# Patient Record
Sex: Female | Born: 1935 | Race: White | Hispanic: No
Health system: Southern US, Community
[De-identification: ages and names within clinical notes are randomized; demographics above are authoritative.]

## PROBLEM LIST (undated history)

## (undated) DIAGNOSIS — E1169 Type 2 diabetes mellitus with other specified complication: Secondary | ICD-10-CM

## (undated) DIAGNOSIS — E119 Type 2 diabetes mellitus without complications: Secondary | ICD-10-CM

## (undated) DIAGNOSIS — E039 Hypothyroidism, unspecified: Secondary | ICD-10-CM

## (undated) DIAGNOSIS — F03C0 Unspecified dementia, severe, without behavioral disturbance, psychotic disturbance, mood disturbance, and anxiety: Secondary | ICD-10-CM

## (undated) DIAGNOSIS — E1159 Type 2 diabetes mellitus with other circulatory complications: Secondary | ICD-10-CM

## (undated) DIAGNOSIS — I152 Hypertension secondary to endocrine disorders: Secondary | ICD-10-CM

## (undated) HISTORY — PX: PARTIAL COLECTOMY: SHX5273

---

## 2002-09-01 ENCOUNTER — Encounter: Payer: Self-pay | Admitting: Orthopedic Surgery

## 2002-09-07 ENCOUNTER — Encounter: Payer: Self-pay | Admitting: Orthopedic Surgery

## 2002-09-07 ENCOUNTER — Inpatient Hospital Stay (HOSPITAL_COMMUNITY): Admission: RE | Admit: 2002-09-07 | Discharge: 2002-09-12 | Payer: Self-pay | Admitting: Orthopedic Surgery

## 2004-03-23 ENCOUNTER — Ambulatory Visit (HOSPITAL_COMMUNITY): Admission: RE | Admit: 2004-03-23 | Discharge: 2004-03-23 | Payer: Self-pay | Admitting: Gastroenterology

## 2004-03-23 ENCOUNTER — Encounter (INDEPENDENT_AMBULATORY_CARE_PROVIDER_SITE_OTHER): Payer: Self-pay | Admitting: Specialist

## 2004-07-04 ENCOUNTER — Encounter: Admission: RE | Admit: 2004-07-04 | Discharge: 2004-07-04 | Payer: Self-pay | Admitting: Internal Medicine

## 2005-07-30 ENCOUNTER — Encounter: Admission: RE | Admit: 2005-07-30 | Discharge: 2005-07-30 | Payer: Self-pay | Admitting: Internal Medicine

## 2010-01-22 ENCOUNTER — Emergency Department (HOSPITAL_COMMUNITY): Admission: EM | Admit: 2010-01-22 | Discharge: 2010-01-22 | Payer: Self-pay | Admitting: Emergency Medicine

## 2010-04-02 ENCOUNTER — Encounter: Payer: Self-pay | Admitting: Internal Medicine

## 2010-04-03 ENCOUNTER — Encounter: Payer: Self-pay | Admitting: Internal Medicine

## 2010-07-28 NOTE — Discharge Summary (Signed)
NAME:  Denise Woodward, Denise Woodward                   ACCOUNT NO.:  [id number]   MEDICAL RECORD NO.:  [id number]                   PATIENT TYPE:  INP   LOCATION:  0455                                 FACILITY:  [facility]   PHYSICIAN:  Ollen Gross, M.D.                 DATE OF BIRTH:  1919/07/27   DATE OF ADMISSION:  09/07/2002  DATE OF DISCHARGE:  09/12/2002                                 DISCHARGE SUMMARY   ADMITTING DIAGNOSES:  1. Osteoarthritis right hip.  2. Anxiety.  3. Hypertension.  4. Urinary incontinence.  5. Non-insulin-dependent diabetes mellitus.  6. Hypothyroidism.  7. History of colon cancer.  8. Glaucoma.  9. 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   DISCHARGE DIAGNOSES:  1. Osteoarthritis right hip, status post right total hip arthroplasty.  2. Postoperative blood loss anemia.  3. Status post transfusion without sequelae.  4. Anxiety.  5. Hypertension.  6. Urinary incontinence.  7. Non-insulin-dependent diabetes mellitus.  8. Hypothyroidism.  9. History of colon cancer.  10.      Glaucoma.  11.      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   PROCEDURE:  Date of surgery September 07, 2002:  Right total knee arthroplasty.  Surgeon:  Dr. Lequita Halt; assisted by Avel Peace, P.A.-C.  Spinal anesthesia.  Estimated blood loss 600 mL.  Hemovac drain x1.   CONSULTS:  Rehab - Dr. Faith Rogue.   BRIEF HISTORY:  An 77 year old female seen by Dr. Lequita Halt.  Her daughter,  [name], currently lives in [place].  The patient currently resides in  [place] but is here visiting her daughter and was developing significantly  worsening right hip pain.  Was seen in the office but did not recall any  specific injury to this.  She does walk quite a bit and is very active.  She  experienced pain in the groin traveling down to the interior thigh and knee.  She was seen in the office where x-rays which are brought from [place] with  the patient demonstrates severe  bone-on-bone arthritis in the right hip with  some osteopenia of the femoral neck.  It is noted that she has a cemented  left total hip in good position on the other side.  The pain in the right  hip has become more debilitating and problematic.  It is felt she had  reached a point where she would benefit from surgery.  Risks and benefits  discussed and the patient elected to proceed with surgery.   LABORATORY DATA:  CBC on admission showed hemoglobin 12.6, hematocrit of  37.3, white cell count 7.4, red cell count 4.08.  Differential all within  normal limits.  Postoperative H&H 9.1 and 26.5, continued to drop down to  8.3 and 24.4.  Given blood.  Posttransfusion hemoglobin 10.4 and 30.5.  PT/PTT on admission were 12.3 and 39 respectively with an INR of 0.9.  Serum  protimes followed.  Last noted PT/INR 22.8/2.3.  Chem panel on admission  showed low sodium of 133, low potassium of 3.4, elevated glucose of 153;  remaining chem panel within normal limits.  Serial BMETs were followed.  Sodium continued to drop down from 133 down to 130, last noted at 129.  Potassium went from 3.4 to 3.6, back down to 3.4, back up to 3.6.  Glucose  dropped from 153 down to 127.  Calcium dropped from 10.1 down to 8.2.  Urinalysis on admission showed moderate leukocyte esterase, few epithelial  cells, 0-2 white cells.  Blood group and type O negative.   X-rays:  Portable pelvis film and hip film postoperatively shows right hip  prosthesis in appropriate position, no evidence of fracture or dislocation.  Preoperative right hip films on September 01, 2002 show osteoarthritis of the  right hip.  I do not see a chest x-ray or EKG.   HOSPITAL COURSE:  The patient was admitted to [facility] on September 07, 2002 and taken to the OR and underwent the above-stated procedure.  She  tolerated the procedure well, was later transferred to the recovery room and  then to the orthopedic floor to continue postoperative care.   Vital signs  were followed.  Hemovac drain placed at the time of surgery was pulled on  postoperative day #1.  The patient underwent a consult by Dr. Riley Kill for  rehab services.  Seen by rehab and felt that due to her excellent condition  that she would probably be able to go home with home health.  The patient  did have some mild postoperative anemia which was followed during the  hospital course, continued to drop, and the patient did receive blood on  postoperative day #3.  The patient was given 24 hours of postoperative IV  antibiotics.  PT and OT consulted for evaluation and treatment.  Initially  placed on PCA, was weaned over to p.o. medications by day #2 when the PCA  was discontinued.  Dressing changes initiated on postoperative day  #2.  Incision was healing well, no signs of infection.  The patient  initially slowly progressed with physical therapy, ambulating approximately  only 20 feet by postoperative day #2, then up to 50 feet by postoperative  day #3, and then up to 100 feet by postoperative day #4.  She did quite well  with the physical therapy in the latter portion of the hospital course.  She  was doing so well she was set up to go home on day #5.  She was allowed to  be partial weightbearing of 50% when she was up moving well.  Arrangements  were made for home care and she was discharged home on September 12, 2002.   DISCHARGE PLAN:  1. The patient was discharged home on September 12, 2002.  2. Discharge diagnoses:  Please see above.  3. Discharge medications:  Coumadin for DVT prophylaxis, Percocet for pain,     Robaxin for spasm.  4. Activity:  Partial weightbearing on the right lower extremity, 50%.  Home     health PT and home health nurse through [facility], total hip     protocol.  5. Follow-up:  Two weeks.  6. Diet:  Low sodium diabetic diet.   DISPOSITION:  Home with her daughter.   CONDITION ON DISCHARGE:  Improved.    Alexzandrew L. Julien Girt, P.A.               Ollen Gross, M.D.  ALP/MEDQ  D:  10/14/2002  T:  10/14/2002  Job:  [id number]

## 2010-07-28 NOTE — Op Note (Signed)
NAME:  Denise Woodward, Denise Woodward                   ACCOUNT NO.:  [id number]   MEDICAL RECORD NO.:  [id number]                   PATIENT TYPE:  INP   LOCATION:  X009                                 FACILITY[facility]  [facility]   PHYSICIAN:  Ollen Gross, M.D.                 DATE OF BIRTH:  09/24/1919   DATE OF PROCEDURE:  09/07/2002  DATE OF DISCHARGE:                                 OPERATIVE REPORT   PREOPERATIVE DIAGNOSIS:  Osteoarthritis, right hip.   POSTOPERATIVE DIAGNOSIS:  Osteoarthritis, right hip.   OPERATION/PROCEDURE:  Right total hip arthroplasty.   SURGEON:  Ollen Gross, M.D.   ASSISTANT:  Alexzandrew L. Julien Girt, P.A.   ANESTHESIA:  Spinal.   ESTIMATED BLOOD LOSS:  600 mL.   DATA REVIEWED:  Hemovac x1.   COMPLICATIONS:  None.   CONDITION:  Stable to recovery room.   BRIEF CLINICAL NOTE:  Denise Woodward is an 77 year old female who has severe  end-stage osteoarthritis of the right hip with pain refractory to  nonoperative management.  She presents now for a right total hip  arthroplasty.   DESCRIPTION OF PROCEDURE:  After the successful administration of spinal  anesthetic, the patient was placed in the left lateral decubitus position  with the right side up and held with the hip positioner.  Right lower  extremity was isolated from her perineum with plastic drapes and prepped and  draped in the usual sterile fashion.  A posterolateral incision was made  with a #10 blade through the subcutaneous tissue to the level of the fascia  lata which was incised in line with the skin incision.  Sciatic nerve was  palpated and protected and short rotators isolated off the femur.  The  capsulectomy was then performed.  The dislocated center of the femoral head  marked and trial prosthesis placed such that the center of the trial head  corresponds to the center of her native femoral head.  Osteotomy line is  marked on the femoral neck and osteotomy is made with an oscillating  saw.  The skin was then retracted anteriorly and acetabulum exposure obtained.   Removal of the labrum and then initiated reaming at 45, increasing  increments of 2 to 49 and a 50 mm Pinnacle acetabular shell was placed in  the anatomic position, transfixed with two dome screws.  Trial 28 mm neutral  +4 liner was placed.   The femur was prepared first at the canal finder and irrigation.  Broaching  is performed to size 1 then size 2, high offset neck with a 28 +0 head.  Head is reduced with excellent stability.  Full extension, flexor rotation,  70 degrees flexion and 40 degrees adduction, 90 degrees internal rotation,  90 degrees flexion, 70 degrees internal rotation.  All the trials were then  removed and the permanent apex fully eliminator and permanent 28 mm neutral  +4 liner placed in the acetabular shell.  Sponges there is  placed to protect  the liner.  The cement restrictor sized to 4 and then a size 4 cement  restrictor was placed into the appropriate depth in the femoral canal and  the canal was prepared with pulsatile lavage.  Cement was mixed and  implantation with hand pressurization.  The size 2 Endurance Luster high  offset stem was placed in anterior anatomic version.  Once the cement had  hardened, a 28 + 1.5 head was placed and hip reduced with the same stability  parameters.  The wound was copiously irrigated with antibiotic solution and  short rotators reattached to the femur through drill holes.  Fascia lata was  closed over a Hemovac drain with interrupted #1 Vicryl, subcu closed with #1  and 2-0 Vicryl, subcuticular running 4-0 Monocryl.  Incision was clean and  dry and Steri-Strips and a bulky sterile dressing applied. She was then  awakened and transported to the recovery in stable condition.                                               Ollen Gross, M.D.    FA/MEDQ  D:  09/07/2002  T:  09/07/2002  Job:  [id number]

## 2010-07-28 NOTE — H&P (Signed)
NAME:  Denise Woodward, Denise Woodward                   ACCOUNT NO.:  [id number]   MEDICAL RECORD NO.:  [id number]                   PATIENT TYPE:  INP   LOCATION:  NA                                   FACILITY:  [facility]   PHYSICIAN:  Ollen Gross, M.D.                 DATE OF BIRTH:  04/11/1919   DATE OF ADMISSION:  09/07/2002  DATE OF DISCHARGE:                                HISTORY & PHYSICAL   CHIEF COMPLAINT:  Right hip pain.   HISTORY OF PRESENT ILLNESS:  This is an 77 year old female seen in  consultation by Ollen Gross, M.D.  Her daughter, Denise Woodward, lives here in  [place].  Currently, the patient is residing in [place] but she is here  visiting and has been developing significantly worsening right hip pain.  She is seen in the office and did not recall any specific injury leading to  this.  She does walk a lot and is quite active.  She is experiencing pain in  the groin traveling down to anterior thigh and knee.  She denies any type of  lower extremity weakness or paraesthesias.  She has not had any swelling in  the knee.  She has had a previous left hip replacement and her symptoms were  just like this prior to at the time that she had to have it replaced.  Her  right hip has gotten progressively worse.  She is seen in the office where x-  rays were brought in from [place] which demonstrate severe bone on bone  arthritis of the right hip with some osteopenia of the femoral neck.  It is  noted she has a cemented left total hip arthroplasty in good position on the  other side.  The patient states the pain is becoming more debilitating and  problematic.  She is at a point where she would like to have something done  about it.  Risks and benefits of the surgery have been discussed with the  patient at length with her and her daughter and she would like to proceed  with surgery.   ALLERGIES:  ASPIRIN.   CURRENT MEDICATIONS:  1. Triamterene/hydrochlorothiazide 37.5/25 daily.  2.  Lotrel 10/20 daily.  3. Toprol XL 100 mg one and one-half tablet daily.  4. Lipitor 10 mg daily.  5. Synthroid tablets 50 mcg daily.  6. Paroxetine/hydrochlorothiazide 10 mg daily.   PAST MEDICAL HISTORY:  1. Anxiety.  2. Hypertension.  3. Urinary incontinence.  4. Non-insulin-dependent diabetes mellitus.  5. Hypothyroidism.  6. Colon cancer.  7. Glaucoma.  8. Cataracts.   PAST SURGICAL HISTORY:  1. Rotator cuff repair.  2. Left total hip replacement arthroplasty.  3. Colon resection.  4. Laser surgery.   SOCIAL HISTORY:  Widowed.  Homemaker.  Nonsmoker.  Occasional intake of  alcohol.  The patient has six children.  Her daughter, Denise Woodward, lives  here in [place], will be assisting with her care.  FAMILY HISTORY:  Father with a history of heart disease, hypertension,  diabetes.  Mother with a history of hypertension.  Has a sister with history  of uterine cancer.  Has siblings that have hypertension.   REVIEW OF SYSTEMS:  GENERAL:  No fevers, chills, night sweats.  NEUROLOGIC:  No seizures, syncope, paralysis.  RESPIRATORY:  No shortness of breath,  productive cough, or hemoptysis.  CARDIOVASCULAR:  No chest pain, angina,  orthopnea.  GASTROINTESTINAL:  No nausea, vomiting, diarrhea, constipation,  or blood or mucus in the stool.  GENITOURINARY:  No dysuria, hematuria,  discharge.  MUSCULOSKELETAL:  Pertinent to that of hip found in the history  of present illness.   PHYSICAL EXAMINATION:  VITAL SIGNS:  Pulse 64, respirations 14, blood  pressure 140/80.  GENERAL:  The patient is an 77 year old female well-nourished, well-  developed.  She is accompanied by her daughter.  She is alert, oriented,  cooperative, very pleasant at time of examination.  She is a good historian.  HEENT:  Normocephalic, atraumatic.  Pupils round, reactive.  Oropharynx  clear.  NECK:  Supple.  CHEST:  Clear to auscultation anterior/posterior chest walls.  No rhonchi,  rales, or wheezing.   HEART:  Regular rate and rhythm.  No murmurs.  S1, S2 noted.  ABDOMEN:  Soft, slightly round, nontender.  She does have an abdominal  hernia from the previous abdominal incision from her colon resection.  RECTAL:  Not done.  Not pertinent to present illness.  BREASTS:  Not done.  Not pertinent to present illness.  GENITALIA:  Not done.  Not pertinent to present illness.  EXTREMITIES:  Significant that to the right lower extremity.  She has hip  flexion about 90 degrees, internal rotation, external rotation at 10 and 20,  abduction of about 20 degrees.  She does have discomfort about the hip on  passive range of motion.   IMPRESSION:  1. Osteoarthritis right hip.  2. Anxiety.  3. Hypertension.  4. Urinary incontinence.  5. Non-insulin-dependent diabetes mellitus.  6. Hypothyroidism.  7. History of colon cancer.  8. Glaucoma.  9. Status post left total hip replacement arthroplasty February 1999.   PLAN:  The patient will be admitted to [facility], undergo a right  total hip replacement arthroplasty.  Surgery will be performed by Ollen Gross, M.D.     Alexzandrew L. Julien Girt, P.A.              Ollen Gross, M.D.    ALP/MEDQ  D:  09/03/2002  T:  09/04/2002  Job:  [id number]

## 2010-07-28 NOTE — Op Note (Signed)
NAMEMARGY, SUMLER         ACCOUNT NO.:  [id number]   MEDICAL RECORD NO.:  [id number]          PATIENT TYPE:  AMB   LOCATION:  ENDO                         FACILITY:  [facility]   PHYSICIAN:  Petra Kuba, M.D.    DATE OF BIRTH:  03/23/1919   DATE OF PROCEDURE:  03/23/2004  DATE OF DISCHARGE:                                 OPERATIVE REPORT   PROCEDURES:  Colonoscopy with biopsy.   INDICATIONS:  History of colon cancer.  Due for repeat screening.  Consent  was signed after the risks, benefits, methods and options were thoroughly  discussed in the office with both the patient and her daughter.   MEDICINES USED:  1.  Demerol 50 mg.  2.  Versed 4 mg.   DESCRIPTION OF PROCEDURE:  Rectal inspection was pertinent for external  hemorrhoids.  Digital exam was negative.  The video pediatric adjustable  coloscope was inserted and easily advanced through the left side of the  colon where the anastomosis was seen through the mid transverse.  At that  point there was some looping.  With rolling her on her back and abdominal  pressure, we were able to advance to the cecum.  There was some formed stool  in the cecum which could not be washed and suctioned.  The rest of the prep  was adequate with minimal liquid stool requiring washing and suctioning.  In  the cecal pole a tiny cecal polyp was seen, was cold biopsied x 1 and put in  a fresh container.  On slow withdrawal through the colon no abnormalities  were seen.  As we slowly withdrew back to the sigmoid anastomosis there were  a few diverticula round this.  The anastomosis was widely patent without any  signs of polypoid tumors or mass.  In the distal rectal and sigmoid, a few  tiny hyperplastic-appearing polyps were seen and were cold biopsied and put  in a second container.  Anorectal pull through and retroflexion confirmed  some small hemorrhoids.  The scope was straightened and readvanced a short  ways up the left side of the colon.   Air was suctioned.  The scope was  removed.  The patient tolerated the procedure well.  There was no obvious  immediate complication.   ENDOSCOPIC DIAGNOSES:  1.  Internal and external hemorrhoids.  2.  Mid sigmoid anastomosis with a few ticks around it.  3.  Three tiny hyperplastic-appearing rectal and distal sigmoid polyps cold      biopsied.  4.  Tiny cecal polyp biopsied.  5.  Otherwise within normal limits to the cecum.   PLAN:  Await pathology, but if doing well medically consider repeat  screening in three to five years.  Happy to see back p.r.n.  Otherwise  return care to Dr. Edwyna Shell for the customary health care maintenance to  include yearly rectals and guaiacs.       MEM/MEDQ  D:  03/23/2004  T:  03/23/2004  Job:  [id number]   cc:   Loraine Leriche A. Waynard Edwards, M.D.  [address]  [place]  [place] [address]  Fax: [phone]

## 2011-03-15 DIAGNOSIS — E039 Hypothyroidism, unspecified: Secondary | ICD-10-CM | POA: Diagnosis not present

## 2011-03-15 DIAGNOSIS — H6120 Impacted cerumen, unspecified ear: Secondary | ICD-10-CM | POA: Diagnosis not present

## 2011-03-15 DIAGNOSIS — M899 Disorder of bone, unspecified: Secondary | ICD-10-CM | POA: Diagnosis not present

## 2011-03-15 DIAGNOSIS — E785 Hyperlipidemia, unspecified: Secondary | ICD-10-CM | POA: Diagnosis not present

## 2011-03-15 DIAGNOSIS — I10 Essential (primary) hypertension: Secondary | ICD-10-CM | POA: Diagnosis not present

## 2011-03-15 DIAGNOSIS — E119 Type 2 diabetes mellitus without complications: Secondary | ICD-10-CM | POA: Diagnosis not present

## 2011-03-15 DIAGNOSIS — M949 Disorder of cartilage, unspecified: Secondary | ICD-10-CM | POA: Diagnosis not present

## 2011-03-19 DIAGNOSIS — Z1212 Encounter for screening for malignant neoplasm of rectum: Secondary | ICD-10-CM | POA: Diagnosis not present

## 2011-03-21 DIAGNOSIS — H6120 Impacted cerumen, unspecified ear: Secondary | ICD-10-CM | POA: Diagnosis not present

## 2011-03-21 DIAGNOSIS — Z124 Encounter for screening for malignant neoplasm of cervix: Secondary | ICD-10-CM | POA: Diagnosis not present

## 2011-03-21 DIAGNOSIS — N183 Chronic kidney disease, stage 3 unspecified: Secondary | ICD-10-CM | POA: Diagnosis not present

## 2011-03-21 DIAGNOSIS — E1139 Type 2 diabetes mellitus with other diabetic ophthalmic complication: Secondary | ICD-10-CM | POA: Diagnosis not present

## 2011-03-21 DIAGNOSIS — Z0000 Encounter for general adult medical examination without abnormal findings: Secondary | ICD-10-CM | POA: Diagnosis not present

## 2011-05-03 DIAGNOSIS — R82998 Other abnormal findings in urine: Secondary | ICD-10-CM | POA: Diagnosis not present

## 2011-05-03 DIAGNOSIS — R300 Dysuria: Secondary | ICD-10-CM | POA: Diagnosis not present

## 2011-06-11 DIAGNOSIS — R82998 Other abnormal findings in urine: Secondary | ICD-10-CM | POA: Diagnosis not present

## 2011-06-11 DIAGNOSIS — R300 Dysuria: Secondary | ICD-10-CM | POA: Diagnosis not present

## 2011-07-23 DIAGNOSIS — F068 Other specified mental disorders due to known physiological condition: Secondary | ICD-10-CM | POA: Diagnosis not present

## 2011-07-23 DIAGNOSIS — E039 Hypothyroidism, unspecified: Secondary | ICD-10-CM | POA: Diagnosis not present

## 2011-07-23 DIAGNOSIS — I10 Essential (primary) hypertension: Secondary | ICD-10-CM | POA: Diagnosis not present

## 2011-07-23 DIAGNOSIS — E119 Type 2 diabetes mellitus without complications: Secondary | ICD-10-CM | POA: Diagnosis not present

## 2011-09-20 DIAGNOSIS — R300 Dysuria: Secondary | ICD-10-CM | POA: Diagnosis not present

## 2011-09-20 DIAGNOSIS — R82998 Other abnormal findings in urine: Secondary | ICD-10-CM | POA: Diagnosis not present

## 2011-11-26 DIAGNOSIS — Z23 Encounter for immunization: Secondary | ICD-10-CM | POA: Diagnosis not present

## 2011-11-26 DIAGNOSIS — F068 Other specified mental disorders due to known physiological condition: Secondary | ICD-10-CM | POA: Diagnosis not present

## 2011-11-26 DIAGNOSIS — E119 Type 2 diabetes mellitus without complications: Secondary | ICD-10-CM | POA: Diagnosis not present

## 2011-11-26 DIAGNOSIS — E785 Hyperlipidemia, unspecified: Secondary | ICD-10-CM | POA: Diagnosis not present

## 2011-11-26 DIAGNOSIS — E039 Hypothyroidism, unspecified: Secondary | ICD-10-CM | POA: Diagnosis not present

## 2012-01-17 DIAGNOSIS — R82998 Other abnormal findings in urine: Secondary | ICD-10-CM | POA: Diagnosis not present

## 2012-01-17 DIAGNOSIS — N390 Urinary tract infection, site not specified: Secondary | ICD-10-CM | POA: Diagnosis not present

## 2012-05-23 DIAGNOSIS — Z79899 Other long term (current) drug therapy: Secondary | ICD-10-CM | POA: Diagnosis not present

## 2012-05-23 DIAGNOSIS — E039 Hypothyroidism, unspecified: Secondary | ICD-10-CM | POA: Diagnosis not present

## 2012-05-23 DIAGNOSIS — E1139 Type 2 diabetes mellitus with other diabetic ophthalmic complication: Secondary | ICD-10-CM | POA: Diagnosis not present

## 2012-05-23 DIAGNOSIS — M899 Disorder of bone, unspecified: Secondary | ICD-10-CM | POA: Diagnosis not present

## 2012-05-23 DIAGNOSIS — I10 Essential (primary) hypertension: Secondary | ICD-10-CM | POA: Diagnosis not present

## 2012-05-30 DIAGNOSIS — Z0000 Encounter for general adult medical examination without abnormal findings: Secondary | ICD-10-CM | POA: Diagnosis not present

## 2012-05-30 DIAGNOSIS — E119 Type 2 diabetes mellitus without complications: Secondary | ICD-10-CM | POA: Diagnosis not present

## 2012-05-30 DIAGNOSIS — N183 Chronic kidney disease, stage 3 unspecified: Secondary | ICD-10-CM | POA: Diagnosis not present

## 2012-05-30 DIAGNOSIS — E785 Hyperlipidemia, unspecified: Secondary | ICD-10-CM | POA: Diagnosis not present

## 2012-05-30 DIAGNOSIS — H409 Unspecified glaucoma: Secondary | ICD-10-CM | POA: Diagnosis not present

## 2012-05-30 DIAGNOSIS — D649 Anemia, unspecified: Secondary | ICD-10-CM | POA: Diagnosis not present

## 2012-05-30 DIAGNOSIS — F068 Other specified mental disorders due to known physiological condition: Secondary | ICD-10-CM | POA: Diagnosis not present

## 2012-05-30 DIAGNOSIS — Z79899 Other long term (current) drug therapy: Secondary | ICD-10-CM | POA: Diagnosis not present

## 2012-05-30 DIAGNOSIS — E039 Hypothyroidism, unspecified: Secondary | ICD-10-CM | POA: Diagnosis not present

## 2012-07-15 DIAGNOSIS — M899 Disorder of bone, unspecified: Secondary | ICD-10-CM | POA: Diagnosis not present

## 2012-07-15 DIAGNOSIS — M949 Disorder of cartilage, unspecified: Secondary | ICD-10-CM | POA: Diagnosis not present

## 2012-08-28 DIAGNOSIS — L909 Atrophic disorder of skin, unspecified: Secondary | ICD-10-CM | POA: Diagnosis not present

## 2012-08-28 DIAGNOSIS — L821 Other seborrheic keratosis: Secondary | ICD-10-CM | POA: Diagnosis not present

## 2012-08-28 DIAGNOSIS — Z411 Encounter for cosmetic surgery: Secondary | ICD-10-CM | POA: Diagnosis not present

## 2012-08-28 DIAGNOSIS — L919 Hypertrophic disorder of the skin, unspecified: Secondary | ICD-10-CM | POA: Diagnosis not present

## 2012-11-05 DIAGNOSIS — R300 Dysuria: Secondary | ICD-10-CM | POA: Diagnosis not present

## 2012-11-05 DIAGNOSIS — R82998 Other abnormal findings in urine: Secondary | ICD-10-CM | POA: Diagnosis not present

## 2012-12-11 DIAGNOSIS — Z23 Encounter for immunization: Secondary | ICD-10-CM | POA: Diagnosis not present

## 2013-01-21 DIAGNOSIS — Z1331 Encounter for screening for depression: Secondary | ICD-10-CM | POA: Diagnosis not present

## 2013-01-21 DIAGNOSIS — E119 Type 2 diabetes mellitus without complications: Secondary | ICD-10-CM | POA: Diagnosis not present

## 2013-01-21 DIAGNOSIS — I10 Essential (primary) hypertension: Secondary | ICD-10-CM | POA: Diagnosis not present

## 2013-01-21 DIAGNOSIS — Z23 Encounter for immunization: Secondary | ICD-10-CM | POA: Diagnosis not present

## 2013-01-21 DIAGNOSIS — Z6828 Body mass index (BMI) 28.0-28.9, adult: Secondary | ICD-10-CM | POA: Diagnosis not present

## 2013-01-21 DIAGNOSIS — F068 Other specified mental disorders due to known physiological condition: Secondary | ICD-10-CM | POA: Diagnosis not present

## 2013-06-03 DIAGNOSIS — N183 Chronic kidney disease, stage 3 unspecified: Secondary | ICD-10-CM | POA: Diagnosis not present

## 2013-06-03 DIAGNOSIS — E1129 Type 2 diabetes mellitus with other diabetic kidney complication: Secondary | ICD-10-CM | POA: Diagnosis not present

## 2013-06-03 DIAGNOSIS — E785 Hyperlipidemia, unspecified: Secondary | ICD-10-CM | POA: Diagnosis not present

## 2013-06-03 DIAGNOSIS — M899 Disorder of bone, unspecified: Secondary | ICD-10-CM | POA: Diagnosis not present

## 2013-06-03 DIAGNOSIS — M949 Disorder of cartilage, unspecified: Secondary | ICD-10-CM | POA: Diagnosis not present

## 2013-06-03 DIAGNOSIS — E039 Hypothyroidism, unspecified: Secondary | ICD-10-CM | POA: Diagnosis not present

## 2013-06-03 DIAGNOSIS — R82998 Other abnormal findings in urine: Secondary | ICD-10-CM | POA: Diagnosis not present

## 2013-06-10 DIAGNOSIS — I10 Essential (primary) hypertension: Secondary | ICD-10-CM | POA: Diagnosis not present

## 2013-06-10 DIAGNOSIS — E039 Hypothyroidism, unspecified: Secondary | ICD-10-CM | POA: Diagnosis not present

## 2013-06-10 DIAGNOSIS — N183 Chronic kidney disease, stage 3 unspecified: Secondary | ICD-10-CM | POA: Diagnosis not present

## 2013-06-10 DIAGNOSIS — H6120 Impacted cerumen, unspecified ear: Secondary | ICD-10-CM | POA: Diagnosis not present

## 2013-06-10 DIAGNOSIS — E119 Type 2 diabetes mellitus without complications: Secondary | ICD-10-CM | POA: Diagnosis not present

## 2013-06-10 DIAGNOSIS — H409 Unspecified glaucoma: Secondary | ICD-10-CM | POA: Diagnosis not present

## 2013-06-10 DIAGNOSIS — F068 Other specified mental disorders due to known physiological condition: Secondary | ICD-10-CM | POA: Diagnosis not present

## 2013-06-10 DIAGNOSIS — E785 Hyperlipidemia, unspecified: Secondary | ICD-10-CM | POA: Diagnosis not present

## 2013-06-10 DIAGNOSIS — Z0000 Encounter for general adult medical examination without abnormal findings: Secondary | ICD-10-CM | POA: Diagnosis not present

## 2013-09-22 DIAGNOSIS — H6120 Impacted cerumen, unspecified ear: Secondary | ICD-10-CM | POA: Diagnosis not present

## 2013-11-02 DIAGNOSIS — F068 Other specified mental disorders due to known physiological condition: Secondary | ICD-10-CM | POA: Diagnosis not present

## 2013-11-02 DIAGNOSIS — I10 Essential (primary) hypertension: Secondary | ICD-10-CM | POA: Diagnosis not present

## 2013-11-02 DIAGNOSIS — E039 Hypothyroidism, unspecified: Secondary | ICD-10-CM | POA: Diagnosis not present

## 2013-11-02 DIAGNOSIS — E119 Type 2 diabetes mellitus without complications: Secondary | ICD-10-CM | POA: Diagnosis not present

## 2013-11-02 DIAGNOSIS — IMO0002 Reserved for concepts with insufficient information to code with codable children: Secondary | ICD-10-CM | POA: Diagnosis not present

## 2013-12-04 DIAGNOSIS — Z23 Encounter for immunization: Secondary | ICD-10-CM | POA: Diagnosis not present

## 2013-12-23 DIAGNOSIS — N390 Urinary tract infection, site not specified: Secondary | ICD-10-CM | POA: Diagnosis not present

## 2014-05-10 DIAGNOSIS — H2513 Age-related nuclear cataract, bilateral: Secondary | ICD-10-CM | POA: Diagnosis not present

## 2014-05-10 DIAGNOSIS — E109 Type 1 diabetes mellitus without complications: Secondary | ICD-10-CM | POA: Diagnosis not present

## 2014-06-22 DIAGNOSIS — I10 Essential (primary) hypertension: Secondary | ICD-10-CM | POA: Diagnosis not present

## 2014-06-22 DIAGNOSIS — E785 Hyperlipidemia, unspecified: Secondary | ICD-10-CM | POA: Diagnosis not present

## 2014-06-22 DIAGNOSIS — R8299 Other abnormal findings in urine: Secondary | ICD-10-CM | POA: Diagnosis not present

## 2014-06-22 DIAGNOSIS — E1129 Type 2 diabetes mellitus with other diabetic kidney complication: Secondary | ICD-10-CM | POA: Diagnosis not present

## 2014-06-22 DIAGNOSIS — N183 Chronic kidney disease, stage 3 (moderate): Secondary | ICD-10-CM | POA: Diagnosis not present

## 2014-06-22 DIAGNOSIS — E039 Hypothyroidism, unspecified: Secondary | ICD-10-CM | POA: Diagnosis not present

## 2014-06-22 DIAGNOSIS — N390 Urinary tract infection, site not specified: Secondary | ICD-10-CM | POA: Diagnosis not present

## 2014-06-29 DIAGNOSIS — N183 Chronic kidney disease, stage 3 (moderate): Secondary | ICD-10-CM | POA: Diagnosis not present

## 2014-06-29 DIAGNOSIS — D649 Anemia, unspecified: Secondary | ICD-10-CM | POA: Diagnosis not present

## 2014-06-29 DIAGNOSIS — F0390 Unspecified dementia without behavioral disturbance: Secondary | ICD-10-CM | POA: Diagnosis not present

## 2014-06-29 DIAGNOSIS — E785 Hyperlipidemia, unspecified: Secondary | ICD-10-CM | POA: Diagnosis not present

## 2014-06-29 DIAGNOSIS — Z6823 Body mass index (BMI) 23.0-23.9, adult: Secondary | ICD-10-CM | POA: Diagnosis not present

## 2014-06-29 DIAGNOSIS — I10 Essential (primary) hypertension: Secondary | ICD-10-CM | POA: Diagnosis not present

## 2014-06-29 DIAGNOSIS — E119 Type 2 diabetes mellitus without complications: Secondary | ICD-10-CM | POA: Diagnosis not present

## 2014-06-29 DIAGNOSIS — H409 Unspecified glaucoma: Secondary | ICD-10-CM | POA: Diagnosis not present

## 2014-06-29 DIAGNOSIS — Z0000 Encounter for general adult medical examination without abnormal findings: Secondary | ICD-10-CM | POA: Diagnosis not present

## 2014-06-29 DIAGNOSIS — N390 Urinary tract infection, site not specified: Secondary | ICD-10-CM | POA: Diagnosis not present

## 2014-06-29 DIAGNOSIS — E039 Hypothyroidism, unspecified: Secondary | ICD-10-CM | POA: Diagnosis not present

## 2014-06-29 DIAGNOSIS — Z1389 Encounter for screening for other disorder: Secondary | ICD-10-CM | POA: Diagnosis not present

## 2014-08-16 DIAGNOSIS — N390 Urinary tract infection, site not specified: Secondary | ICD-10-CM | POA: Diagnosis not present

## 2014-12-19 DIAGNOSIS — Z23 Encounter for immunization: Secondary | ICD-10-CM | POA: Diagnosis not present

## 2015-07-01 DIAGNOSIS — R300 Dysuria: Secondary | ICD-10-CM | POA: Diagnosis not present

## 2015-08-19 DIAGNOSIS — R300 Dysuria: Secondary | ICD-10-CM | POA: Diagnosis not present

## 2015-08-22 DIAGNOSIS — E038 Other specified hypothyroidism: Secondary | ICD-10-CM | POA: Diagnosis not present

## 2015-08-22 DIAGNOSIS — M859 Disorder of bone density and structure, unspecified: Secondary | ICD-10-CM | POA: Diagnosis not present

## 2015-08-22 DIAGNOSIS — E1129 Type 2 diabetes mellitus with other diabetic kidney complication: Secondary | ICD-10-CM | POA: Diagnosis not present

## 2015-08-22 DIAGNOSIS — E784 Other hyperlipidemia: Secondary | ICD-10-CM | POA: Diagnosis not present

## 2015-08-22 DIAGNOSIS — N183 Chronic kidney disease, stage 3 (moderate): Secondary | ICD-10-CM | POA: Diagnosis not present

## 2015-09-01 DIAGNOSIS — D6489 Other specified anemias: Secondary | ICD-10-CM | POA: Diagnosis not present

## 2015-09-01 DIAGNOSIS — Z6828 Body mass index (BMI) 28.0-28.9, adult: Secondary | ICD-10-CM | POA: Diagnosis not present

## 2015-09-01 DIAGNOSIS — H4089 Other specified glaucoma: Secondary | ICD-10-CM | POA: Diagnosis not present

## 2015-09-01 DIAGNOSIS — E038 Other specified hypothyroidism: Secondary | ICD-10-CM | POA: Diagnosis not present

## 2015-09-01 DIAGNOSIS — I10 Essential (primary) hypertension: Secondary | ICD-10-CM | POA: Diagnosis not present

## 2015-09-01 DIAGNOSIS — H6121 Impacted cerumen, right ear: Secondary | ICD-10-CM | POA: Diagnosis not present

## 2015-09-01 DIAGNOSIS — N183 Chronic kidney disease, stage 3 (moderate): Secondary | ICD-10-CM | POA: Diagnosis not present

## 2015-09-01 DIAGNOSIS — Z0000 Encounter for general adult medical examination without abnormal findings: Secondary | ICD-10-CM | POA: Diagnosis not present

## 2015-09-01 DIAGNOSIS — E1129 Type 2 diabetes mellitus with other diabetic kidney complication: Secondary | ICD-10-CM | POA: Diagnosis not present

## 2015-09-01 DIAGNOSIS — F0390 Unspecified dementia without behavioral disturbance: Secondary | ICD-10-CM | POA: Diagnosis not present

## 2015-09-01 DIAGNOSIS — M859 Disorder of bone density and structure, unspecified: Secondary | ICD-10-CM | POA: Diagnosis not present

## 2015-09-01 DIAGNOSIS — Z1389 Encounter for screening for other disorder: Secondary | ICD-10-CM | POA: Diagnosis not present

## 2015-11-20 DIAGNOSIS — Z23 Encounter for immunization: Secondary | ICD-10-CM | POA: Diagnosis not present

## 2016-05-18 DIAGNOSIS — F0390 Unspecified dementia without behavioral disturbance: Secondary | ICD-10-CM | POA: Diagnosis not present

## 2016-05-18 DIAGNOSIS — I10 Essential (primary) hypertension: Secondary | ICD-10-CM | POA: Diagnosis not present

## 2016-05-18 DIAGNOSIS — E038 Other specified hypothyroidism: Secondary | ICD-10-CM | POA: Diagnosis not present

## 2016-05-18 DIAGNOSIS — Z6828 Body mass index (BMI) 28.0-28.9, adult: Secondary | ICD-10-CM | POA: Diagnosis not present

## 2016-05-18 DIAGNOSIS — E1129 Type 2 diabetes mellitus with other diabetic kidney complication: Secondary | ICD-10-CM | POA: Diagnosis not present

## 2016-05-18 DIAGNOSIS — E784 Other hyperlipidemia: Secondary | ICD-10-CM | POA: Diagnosis not present

## 2016-10-26 DIAGNOSIS — Z0000 Encounter for general adult medical examination without abnormal findings: Secondary | ICD-10-CM | POA: Diagnosis not present

## 2016-10-26 DIAGNOSIS — E1129 Type 2 diabetes mellitus with other diabetic kidney complication: Secondary | ICD-10-CM | POA: Diagnosis not present

## 2016-10-26 DIAGNOSIS — Z6828 Body mass index (BMI) 28.0-28.9, adult: Secondary | ICD-10-CM | POA: Diagnosis not present

## 2016-10-26 DIAGNOSIS — I10 Essential (primary) hypertension: Secondary | ICD-10-CM | POA: Diagnosis not present

## 2016-10-26 DIAGNOSIS — Z794 Long term (current) use of insulin: Secondary | ICD-10-CM | POA: Diagnosis not present

## 2016-10-26 DIAGNOSIS — F0390 Unspecified dementia without behavioral disturbance: Secondary | ICD-10-CM | POA: Diagnosis not present

## 2016-10-26 DIAGNOSIS — N183 Chronic kidney disease, stage 3 (moderate): Secondary | ICD-10-CM | POA: Diagnosis not present

## 2016-10-26 DIAGNOSIS — H6123 Impacted cerumen, bilateral: Secondary | ICD-10-CM | POA: Diagnosis not present

## 2016-10-26 DIAGNOSIS — E038 Other specified hypothyroidism: Secondary | ICD-10-CM | POA: Diagnosis not present

## 2016-10-26 DIAGNOSIS — M859 Disorder of bone density and structure, unspecified: Secondary | ICD-10-CM | POA: Diagnosis not present

## 2016-10-26 DIAGNOSIS — H4089 Other specified glaucoma: Secondary | ICD-10-CM | POA: Diagnosis not present

## 2016-10-26 DIAGNOSIS — D6489 Other specified anemias: Secondary | ICD-10-CM | POA: Diagnosis not present

## 2016-10-26 DIAGNOSIS — E784 Other hyperlipidemia: Secondary | ICD-10-CM | POA: Diagnosis not present

## 2017-04-25 DIAGNOSIS — N183 Chronic kidney disease, stage 3 (moderate): Secondary | ICD-10-CM | POA: Diagnosis not present

## 2017-04-25 DIAGNOSIS — F0390 Unspecified dementia without behavioral disturbance: Secondary | ICD-10-CM | POA: Diagnosis not present

## 2017-04-25 DIAGNOSIS — E038 Other specified hypothyroidism: Secondary | ICD-10-CM | POA: Diagnosis not present

## 2017-04-25 DIAGNOSIS — E1129 Type 2 diabetes mellitus with other diabetic kidney complication: Secondary | ICD-10-CM | POA: Diagnosis not present

## 2017-04-25 DIAGNOSIS — I10 Essential (primary) hypertension: Secondary | ICD-10-CM | POA: Diagnosis not present

## 2019-06-09 ENCOUNTER — Other Ambulatory Visit: Payer: Self-pay | Admitting: Critical Care Medicine

## 2019-06-09 ENCOUNTER — Encounter: Payer: Self-pay | Admitting: Critical Care Medicine

## 2019-06-09 DIAGNOSIS — F0390 Unspecified dementia without behavioral disturbance: Secondary | ICD-10-CM | POA: Insufficient documentation

## 2019-06-09 DIAGNOSIS — E785 Hyperlipidemia, unspecified: Secondary | ICD-10-CM | POA: Insufficient documentation

## 2019-06-09 DIAGNOSIS — F419 Anxiety disorder, unspecified: Secondary | ICD-10-CM | POA: Insufficient documentation

## 2019-06-09 DIAGNOSIS — E039 Hypothyroidism, unspecified: Secondary | ICD-10-CM | POA: Insufficient documentation

## 2019-06-09 DIAGNOSIS — E1169 Type 2 diabetes mellitus with other specified complication: Secondary | ICD-10-CM | POA: Insufficient documentation

## 2019-06-09 DIAGNOSIS — E119 Type 2 diabetes mellitus without complications: Secondary | ICD-10-CM | POA: Insufficient documentation

## 2019-06-09 DIAGNOSIS — I10 Essential (primary) hypertension: Secondary | ICD-10-CM | POA: Insufficient documentation

## 2019-06-09 NOTE — Progress Notes (Signed)
Putting meds and problems in chart from Dr Waynard Edwards

## 2019-06-10 ENCOUNTER — Encounter: Payer: Self-pay | Admitting: Critical Care Medicine

## 2019-06-11 ENCOUNTER — Encounter: Payer: Self-pay | Admitting: Critical Care Medicine

## 2019-06-11 DIAGNOSIS — Z66 Do not resuscitate: Secondary | ICD-10-CM | POA: Insufficient documentation

## 2019-06-11 NOTE — Progress Notes (Signed)
  This is documentation for Ms. Denise Woodward who received a Janseen coronavirus Johnson and Johson  vaccine on June 10, 2019.  The pharmacist from [facility] came to the home and I came to the home and met with the patient's family.  This patient has severe dementia not able to sign consent for herself.  I reviewed the patient's records including records from her primary care provider Dr. Haynes Kerns and indicated that she was not a high risk for receiving the vaccine.  She did not have significant allergies.  The pharmacist administered in the right deltoid the vaccine and we observe the patient for 15 minutes and there were no significant aftereffects.  I called the patient's daughter the next day and she indicated the patient did well overnight.  This is documentation of the vaccination that did occur and the pharmacist will enter the data in the state database for this vaccine

## 2019-06-26 ENCOUNTER — Telehealth: Payer: Self-pay | Admitting: *Deleted

## 2019-06-26 NOTE — Telephone Encounter (Signed)
Received a Palliative care referral from Dr. Waynard Edwards. Called and left a voicemail for patient's daughter, [name], to schedule a home visit. Left contact information for return call.

## 2019-07-14 ENCOUNTER — Telehealth: Payer: Self-pay | Admitting: *Deleted

## 2019-07-14 NOTE — Telephone Encounter (Signed)
Received a return call from patient's daughter to schedule a Palliative care home visit. Visit scheduled for 07/15/19 at 11:30a.

## 2019-07-15 ENCOUNTER — Other Ambulatory Visit: Payer: Self-pay

## 2019-07-15 ENCOUNTER — Other Ambulatory Visit: Payer: Medicare Other | Admitting: *Deleted

## 2019-07-15 ENCOUNTER — Other Ambulatory Visit: Payer: Medicare Other

## 2019-07-15 DIAGNOSIS — Z515 Encounter for palliative care: Secondary | ICD-10-CM

## 2019-07-15 NOTE — Progress Notes (Signed)
COMMUNITY PALLIATIVE CARE RN NOTE  PATIENT NAME: Denise Woodward DOB: [birth year] MRN: [id number]  PRIMARY CARE PROVIDER: Rodrigo Ran, MD  RESPONSIBLE PARTY: [name] (daughter) Acct ID - Guarantor Home Phone Work Phone Relationship Acct Type  [id number] [facility]   Self P/F     [address], [place], [place] [address]   Covid-19 Pre-screening Negative  PLAN OF CARE and INTERVENTION:  1. ADVANCE CARE PLANNING/GOALS OF CARE: Goal is for patient to remain in the home with her daughter. She has a DNR.  2. PATIENT/CAREGIVER EDUCATION: Explained Palliative Care services,Symptom Management, skin breakdown management/prevention  3. DISEASE STATUS: Joint visit made with Palliative care SW, Monica Lonon. Upon arrival, patient is lying in bed awake. She is very HOH. She remained non-verbal during visit, but did nod her head to some questions asked. She calls out frequently, but does not appear to be in any distress. She also has some visual hallucinations. She is taking Depakote which does help.  Daughter states that patient has times where she will stay awake for 48 hours, then crash and sleep. No physical indicators of pain noted. No dyspnea noted. She is total care with all ADLs. She is transferred via 2 person assistance. She can take a few steps and occasionally can walk from her room to the room beside her with assistance. She sits up for about 3 hours per day. She takes a shower once weekly on Saturdays. She is able to feed herself finger foods, but because of her shakiness must be fed foods that require utensils. She has a good appetite and fluid intake. She takes her medications crushed in applesauce. No dysphagia, other that some slight coughing first thing in the mornings when she drinks her coffee. She is a diabetic and takes Insulin. Family will check her blood sugars if she has a change in condition. She is incontinent of both bowel and bladder. She takes Miralax prn. She wears adult  briefs. She has 3 small red areas along the crease of her left buttocks. They have started giving patient Vitamin C supplements and applying Desitin to help. This started yesterday. Areas are not opened. She is turned every 3 hours. She has a hired caregiver, Maryruth Hancock, who is with patient 40 hours/week. Daughter is agreeable to future Palliative care visits. Will continue to monitor.  HISTORY OF PRESENT ILLNESS:  This is a 86 yo female with a h/o dementia, HTN, DM II, hyperlipidemia and hypothyroidism. Palliative care team was asked to follow patient for additional support. Will visit patient monthly and PRN.  CODE STATUS: DNR (left updated copy in the home)  ADVANCED DIRECTIVES: Y MOST FORM: no PPS: 30%   PHYSICAL EXAM:   VITALS: Today's Vitals   07/15/19 1157  BP: 117/63  Pulse: (!) 56  Resp: 17  Temp: (!) 97.2 F (36.2 C)  TempSrc: Temporal  SpO2: 90%  PainSc: 0-No pain    LUNGS: clear to auscultation  CARDIAC: Cor Brady EXTREMITIES: No edema SKIN: See above note  NEURO: Awake and alert, non-verbal during visit, HOH, generalized weakness, total care   (Duration of visit and documentation 90 minutes)   Candiss Norse, RN BSN

## 2019-07-16 NOTE — Progress Notes (Signed)
COMMUNITY PALLIATIVE CARE SW NOTE  PATIENT NAME: Denise Woodward DOB: 30-May-1919 MRN: [id number]  PRIMARY CARE PROVIDER: Rodrigo Ran, MD  RESPONSIBLE PARTY:  Acct ID - Guarantor Home Phone Work Phone Relationship Acct Type  [id number] [facility]*   Self P/F     [address], [place], [place] [address]     PLAN OF CARE and INTERVENTIONS:             1. GOALS OF CARE/ ADVANCE CARE PLANNING: The goal is care for patient at home. Patient is a DNR. 2. SOCIAL/EMOTIONAL/SPIRITUAL ASSESSMENT/ INTERVENTIONS:  SW and RN-M.Dimas Aguas completed a face-to-face visit with patient at her home. Her daughter and sitter was present at the visit. Patient is total care and bedbound at this time. She has some coughing with her morning coffee. She is a two person assist. Patient has intermittent hallucinations. Patient is able to pick up some finger foods and her medications are crushed in applesauce. 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 Patient has been vaccinated. Her daughter provided social history on patient. Patient has been born in [place], [place]. Patient completed nursing school at [employer]. 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 She has been widowed 27 years and she has 5 years. She has a very supportive family network. 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 SW provided education, supportive presence, active listening, assessed need and comfort of patient, coping and needs of the PCG. SW reinforced available support through palliative care program.  3. PATIENT/CAREGIVER EDUCATION/ COPING: Patient is hard of hearing and is alert and oriented to self only. She has a supportive family and extended family network.  4. PERSONAL EMERGENCY PLAN: 911 can be activated for emergencies. 5. COMMUNITY RESOURCES COORDINATION/ HEALTH CARE NAVIGATION: Patient has a Engineer, site 40 hours a week. No other resources needed at this  time. 6. FINANCIAL/LEGAL CONCERNS/INTERVENTIONS: No legal or financial issues.     SOCIAL HX:  Social History   Tobacco Use  . Smoking status: Not on file  Substance Use Topics  . Alcohol use: Not on file    CODE STATUS:   Code Status: Not on file  ADVANCED DIRECTIVES: N MOST FORM COMPLETE:  No HOSPICE EDUCATION PROVIDED: No  PPS: Patient is bedbound and total care. She is hard of hearing, but alert and oriented to self.   Duration of visit was 60 minutes.       [address] [place], [place]

## 2019-10-15 ENCOUNTER — Telehealth: Payer: Self-pay | Admitting: *Deleted

## 2019-10-15 NOTE — Telephone Encounter (Signed)
Late Entry from 10/14/19  Received a call from patient's daughter, [name], to advise that patient has had a deep ingrown hair on the outer aspect of her left thigh for quite a while. Over the weekend, patient began picking and scratching this area which now has a small pin point opening noted above the ingrown hair with surrounding redness and swelling that is about an inch in diameter. She has been applying warm compresses to this area and applying antibiotic ointment but the area does not appear to be improving and from the pictures sent, appears to be infected.   Called and left a voicemail with Dr. Laurey Morale office to make aware of findings. Received a call back from his nurse who advised that Dr. Waynard Edwards ordered Doxycyline and it has been called to [facility]. He instructed to continue warm compresses and antibiotic ointment. He also states that if she needs a referral for general surgery to remove the ingrown hair then to call him back and let them know and they will send one.  Called [name] and made her aware of new orders and instructions. She is appreciative. Next Palliative care visit scheduled for 8/11@11a .

## 2019-10-21 ENCOUNTER — Other Ambulatory Visit: Payer: Medicare Other | Admitting: *Deleted

## 2019-10-21 ENCOUNTER — Other Ambulatory Visit: Payer: Self-pay

## 2019-10-21 VITALS — BP 144/82 | HR 60 | Temp 97.9°F | Resp 17

## 2019-10-21 DIAGNOSIS — Z515 Encounter for palliative care: Secondary | ICD-10-CM

## 2019-11-11 NOTE — Progress Notes (Signed)
COMMUNITY PALLIATIVE CARE RN NOTE  PATIENT NAME: Denise Woodward DOB: 20-Aug-1919 MRN: [id number]  PRIMARY CARE PROVIDER: Crist Infante, MD  RESPONSIBLE PARTY: [name] (daughter) Acct ID - Guarantor Home Phone Work Phone Relationship Acct Type  [id number] [facility]*   Self P/F     [address] [place], [place] [address]   Covid-19 Pre-screening Negative  PLAN OF CARE and INTERVENTION:  1. ADVANCE CARE PLANNING/GOALS OF CARE: Goal is for patient to remain at home with her daughter. She has a DNR. 2. PATIENT/CAREGIVER EDUCATION: Symptom management, safe mobility/transfers, s/s of infection 3. DISEASE STATUS: Met with patient, daughter and hired caregiver in the home. Upon arrival, patient is lying in bed asleep. No physical indicators of pain noted. Oxygen level was 89% on room air, however patient was asleep and did not appear to be in any distress. Daughter states patient continues with periods where she may be awake for 24-48 hours, then "crash" for the next couple of days and sleep the majority of the time. She is currently in a sleepy state. Last week, patient was placed on Doxycycline x 7 days for cellulitis surrounding an ingrown hair on her left outer thigh. She completed her last dose of antibiotics this morning. Area looks much better. Area is no longer red, but slightly swollen. Area has been draining some but this has slowed. They continue to apply warm compresses to this area twice daily and apply Mupirocin ointment. Her intake is good. No dysphagia. She is total care with ADLs. She can feed herself finger foods. Medications given crushed in applesauce. They do get her up a few times per week to a recliner. She requires 2 person assistance with transfers into and out of the wheelchair. She is non-ambulatory. Will continue to monitor.  HISTORY OF PRESENT ILLNESS: This is a 86 yo female with a h/o dementia, HTN, DM II, hyperlipidemia and hypothyroidism. Palliative care team  continues to follow patient and will visit monthly and PRN.   CODE STATUS: DNR  ADVANCED DIRECTIVES: Y MOST FORM: no PPS: 30%   PHYSICAL EXAM:   VITALS: Today's Vitals   10/21/19 1327  BP: (!) 144/82  Pulse: 60  Resp: 17  Temp: 97.9 F (36.6 C)  TempSrc: Temporal  SpO2: (!) 89%  PainSc: 0-No pain    LUNGS: clear to auscultation  CARDIAC: Cor RRR EXTREMITIES: No edema SKIN: Skin slightly dark surrounding ingrown hair to left outer thigh  NEURO: Lethargic, generalized weakness, wheelchair bound   (Duration of visit and documentation 60 minutes)   Daryl Eastern, RN, BSN

## 2019-11-30 ENCOUNTER — Other Ambulatory Visit: Payer: Medicare Other | Admitting: *Deleted

## 2019-11-30 DIAGNOSIS — Z515 Encounter for palliative care: Secondary | ICD-10-CM

## 2019-12-01 ENCOUNTER — Other Ambulatory Visit: Payer: Self-pay

## 2019-12-11 NOTE — Progress Notes (Signed)
COMMUNITY PALLIATIVE CARE RN NOTE  PATIENT NAME: Denise Woodward DOB: 25-Aug-1919 MRN: [id number]  PRIMARY CARE PROVIDER: Rodrigo Ran, MD  RESPONSIBLE PARTY: [name] (daughter) Acct ID - Guarantor Home Phone Work Phone Relationship Acct Type  [id number] Denise Woodward, Denise Woodward* [phone]  Self P/F     [address], [place], [place] [address]   Due to the COVID-19 crisis, this virtual check-in visit was done via telephone from my office and it was initiated and consent by this patient and or family.  PLAN OF CARE and INTERVENTION:  1. ADVANCE CARE PLANNING/GOALS OF CARE: Goal is for patient to remain the home with her daughter. She has a DNR. 2. PATIENT/CAREGIVER EDUCATION: Symptom management, safe mobility/transfers 3. DISEASE STATUS: Virtual check-in visit completed via telephone. Patient is not showing any physical indicators of pain per daughter. Her breathing is regular and unlabored. Her oxygen level is 93% on room air. Daughter states that patient has been awake all day yesterday, last night and today. She continues to have cycles of wakefulness for days, then will "crash" and sleep for a few days. During her awake days, she is constantly talking and sees things. This does not seem to cause her any distress. They continue to get her up to her recliner days when she is more alert for about 3 hours. She requires 2 person assistance with transfers and is transported via wheelchair. She requires assistance with all ADLs and is incontinent of both bowel and bladder. The swollen area to her outer left thigh from recent cellulitis has flattened out. It is dark with some scarring noted. They place a towel over it to keep patient from rubbing this area. After she completed Doxycycline last month, the next day she was experiencing some tongue swelling and some difficulties swallowing her food. Benadryl was given several times and symptoms resolved. Unsure of cause. Her intake is good and daughter denies  dysphagia. Will continue to monitor.    HISTORY OF PRESENT ILLNESS:This is a 86 yo female with a h/o dementia, HTN, DM II, hyperlipidemia and hypothyroidism. Palliative care team continues to follow patient and will visit monthly and PRN.     CODE STATUS: DNR  ADVANCED DIRECTIVES: Y MOST FORM: no PPS: 30%   (Duration of visit and documentation 30 minutes)   Candiss Norse, RN BSN

## 2020-01-07 ENCOUNTER — Other Ambulatory Visit: Payer: Medicare Other | Admitting: *Deleted

## 2020-01-07 ENCOUNTER — Other Ambulatory Visit: Payer: Self-pay

## 2020-01-07 DIAGNOSIS — Z515 Encounter for palliative care: Secondary | ICD-10-CM

## 2020-01-12 NOTE — Progress Notes (Signed)
COMMUNITY PALLIATIVE CARE RN NOTE  PATIENT NAME: Denise Woodward DOB: 07/08/1919 MRN: [id number]  PRIMARY CARE PROVIDER: Rodrigo Ran, MD  RESPONSIBLE PARTY: [name] (daughter) Acct ID - Guarantor Home Phone Work Phone Relationship Acct Type  [id number] AVAYAH, RAFFETY* [phone]  Self P/F     [address] [place], [place] [address]   Due to the COVID-19 crisis, this virtual check-in visit was done via telephone from my office and it was initiated and consent by this patient and or family.  PLAN OF CARE and INTERVENTION:  1. ADVANCE CARE PLANNING/GOALS OF CARE: Goal is for patient to remain at home with her daughter. She is a DNR. 2. PATIENT/CAREGIVER EDUCATION: Symptom management, safe mobility/transfers 3. DISEASE STATUS: Virtual check-in visit completed via telephone. Patient is currently lying in bed awake. She is not experiencing any physical indicators of pain. No reports of shortness of breath. Daughter reports that patient continues to have episodes where she is awake for 24-36 hours, then will sleep for the next 24 hours. These cycles are starting to occur closer together. During her awake times, patient talks constantly. Daughter stays up with patient during these times. She continues with a hired Biomedical scientist to assist patient will personal care needs and to allow time for her daughter to rest and run errands. They continue to get patient up with 2 person assistance and transports her to her recliner when she is in her awake cycle. Her appetite is good. She denies dysphagia. No skin issues at this time. All issues with her ingrown hair, warmth/swelling/drainage to her outer left thigh have subsided. She has received her flu shot this week. She will receive her Booster vaccine towards the middle of next week. Will continue to monitor.   HISTORY OF PRESENT ILLNESS:  This is a 86 yo female with a h/o dementia, HTN, DM II, hyperlipidemia and hypothyroidism. Palliative care  team continues to follow patient and will visit monthly and PRN.   CODE STATUS: DNR ADVANCED DIRECTIVES: Y MOST FORM: no PPS: 30%   (Duration of visit and documentation 30 minutes)   Candiss Norse, RN BSN

## 2020-02-03 ENCOUNTER — Other Ambulatory Visit: Payer: Self-pay

## 2020-02-03 ENCOUNTER — Other Ambulatory Visit: Payer: Medicare Other | Admitting: *Deleted

## 2020-02-03 DIAGNOSIS — Z515 Encounter for palliative care: Secondary | ICD-10-CM

## 2020-02-11 NOTE — Progress Notes (Signed)
COMMUNITY PALLIATIVE CARE RN NOTE  PATIENT NAME: Denise Woodward DOB: 11-27-1919 MRN: [id number]  PRIMARY CARE PROVIDER: Rodrigo Ran, MD  RESPONSIBLE PARTY:  Acct ID - Guarantor Home Phone Work Phone Relationship Acct Type  [id number] Denise Woodward, Denise Woodward* [phone]  Self P/F     [address], [place], [place] [address]   Due to the COVID-19 crisis, this virtual check-in visit was done via telephone from my office and it was initiated and consent by this patient and or family.  PLAN OF CARE and INTERVENTION:  1. ADVANCE CARE PLANNING/GOALS OF CARE: Goal is for patient to remain at home with her daughter. She has a DNR. 2. PATIENT/CAREGIVER EDUCATION: Symptom management, safe transfers, s/s of infection 3. DISEASE STATUS: Virtual check-in visit completed via telephone. Patient is not experiencing any pain or shortness of breath. She continues with periods where she is awake for 24-36 hours then will sleep for about the same amount of time. These episodes are occurring closer together. She continues with a hired caregiver 40 hours per week to assist with personal care needs. She is total care with all ADLs. She requires 2 person assistance with transfers and is transported via wheelchair. Daughter will have her up in the recliner for a few hours on days she is more alert. Her intake is good. No issues with dysphagia. No further issues with infection, swelling or redness to the ingrown hair she has had for many years on the outer aspect of her left thigh. No skin issues. She feels that her condition is stable at this time. Will continue to monitor.  HISTORY OF PRESENT ILLNESS: This is a 86 yo female with a h/o dementia, HTN, DM II, hyperlipidemia and hypothyroidism. Palliative care teamcontinues to follow patient and will visit monthly and PRN.   CODE STATUS: DNR ADVANCED DIRECTIVES: Y MOST FORM: no PPS: 30%   (Duration of visit and documentation 20 minutes)   Candiss Norse, RN BSN

## 2020-03-02 ENCOUNTER — Telehealth: Payer: Self-pay

## 2020-03-02 NOTE — Telephone Encounter (Signed)
Telephone call to patient to schedule palliative care visit with patient. SW left a message for patient's daughter-[name] requesting a call back to schedule a visit.

## 2020-03-07 ENCOUNTER — Telehealth: Payer: Self-pay | Admitting: *Deleted

## 2020-03-07 NOTE — Telephone Encounter (Signed)
Called and left a voicemail with patient's daughter, [name], to schedule a palliative care visit. Contact information left for return call.

## 2020-03-10 ENCOUNTER — Other Ambulatory Visit: Payer: Medicare Other | Admitting: *Deleted

## 2020-03-10 ENCOUNTER — Other Ambulatory Visit: Payer: Self-pay

## 2020-03-10 DIAGNOSIS — Z515 Encounter for palliative care: Secondary | ICD-10-CM

## 2020-03-18 ENCOUNTER — Other Ambulatory Visit: Payer: Medicare Other | Admitting: *Deleted

## 2020-03-18 ENCOUNTER — Other Ambulatory Visit: Payer: Self-pay

## 2020-03-18 DIAGNOSIS — Z515 Encounter for palliative care: Secondary | ICD-10-CM

## 2020-03-22 NOTE — Progress Notes (Signed)
PATIENT NAME: Denise Woodward DOB: 1920-01-18 MRN: [id number]  PRIMARY CARE PROVIDER: Rodrigo Ran, MD  RESPONSIBLE PARTY: Denise Woodward (daughter) Acct ID - Guarantor Home Phone Work Phone Relationship Acct Type  [id number] [name], [name] [phone]  Self P/F     [address], [place], [place] [address]   Due to the COVID-19 crisis, this virtual check-in visit was done via telephone from my office and it was initiated and consent by this patient and or family.  PLAN OF CARE and INTERVENTION:  1. ADVANCE CARE PLANNING/GOALS OF CARE: Goal is for patient to remain in the home with her daughter. She has a DNR. 2. PATIENT/CAREGIVER EDUCATION: Symptom management, safe transfers 3. DISEASE STATUS: Virtual check-in visit completed via telephone. Patient is currently lying in bed asleep. She continues to have the sleep-wake cycles of 2-3 days. During her awake moments she talks almost continuously, but does not appear to be in any distress. No pain or breathing issues. She is dependent with all ADLs. She requires 2 person assistance with transfers and transported via wheelchair. Her appetite remains good without any swallowing difficulties. She is incontinent of both bowel and bladder. No current issues with redness or skin breakdown. She continues with a hired caregiver who is a retired Engineer, civil (consulting) to assist with personal care needs. No new issues to report. Will continue to monitor.   HISTORY OF PRESENT ILLNESS:  This is a 87 yo female with a h/o dementia, HTN, DM II, hyperlipidemia and hypothyroidism. Palliative care teamcontinues to follow patient and will visit monthly and PRN.   CODE STATUS: DNR ADVANCED DIRECTIVES: Y MOST FORM: no PPS: 30%   (Duration of visit and documentation 20 minutes)   Candiss Norse, RN BSN

## 2020-03-29 NOTE — Progress Notes (Signed)
COMMUNITY PALLIATIVE CARE RN NOTE  PATIENT NAME: Denise Woodward DOB: Mar 28, 1919 MRN: [id number]  PRIMARY CARE PROVIDER: Rodrigo Ran, MD  RESPONSIBLE PARTY: [name] (daughter) Acct ID - Guarantor Home Phone Work Phone Relationship Acct Type  [id number] [name], CIVIL* [phone]  Self P/F     [address], [place], [place] [address]   Due to the COVID-19 crisis, this virtual check-in visit was done via telephone from my office and it was initiated and consent by this patient and or family.  PLAN OF CARE and INTERVENTION:  1. ADVANCE CARE PLANNING/GOALS OF CARE: Goal is for patient to remain at home with her daughter. She has a DNR. 2. PATIENT/CAREGIVER EDUCATION: Symptom management, safe transfers, s/s of infection 3. DISEASE STATUS: Virtual check-in visit completed via telephone. Daughter denies any physical indicators of pain at this time. [name] states that patient has been awake for about 56 hours, which is one of the longest times that she has remained awake. During these times patient talks almost the entire time. She has finally fallen asleep. [name] does state that she has noticed that patient is constantly grabbing and digging in her ear. She has a history of wax build-up in her ear that requires irrigation at a ENT office, as regular over the counter agents such as Debrox have been ineffective. She is unable to get patient out of the house. She is inquiring whether there are any ENTs that come into the home. I advised her that I do not know of any. The only things I am aware of are the ear irrigation systems found over the counter. She remains total care for all ADLs. She is transferred via 2 person assistance to a wheelchair and transported to her recliner on days where she is awake. Her intake remains good. She is incontinent of both bowel and bladder and wears adult briefs. No skin issues reported at this time. No recent issues reported with the ingrown hair on the outer aspect of  her left thigh. She continues with a privately hired RN 40 hours per week to assist with personal care needs. Will continue to monitor.   HISTORY OF PRESENT ILLNESS:This is a 87 yo female with a h/o dementia, HTN, DM II, hyperlipidemia and hypothyroidism. Palliative care teamcontinues to follow patient and will visit monthly and PRN.    CODE STATUS: DNR ADVANCED DIRECTIVES: Y MOST FORM: no PPS: 30%   (Duration of visit and documentation 30 minutes)   Candiss Norse, RN BSN

## 2020-05-04 ENCOUNTER — Other Ambulatory Visit: Payer: Self-pay

## 2020-05-04 ENCOUNTER — Other Ambulatory Visit: Payer: Medicare Other | Admitting: *Deleted

## 2020-05-04 DIAGNOSIS — Z515 Encounter for palliative care: Secondary | ICD-10-CM

## 2020-05-10 NOTE — Progress Notes (Signed)
COMMUNITY PALLIATIVE CARE RN NOTE  PATIENT NAME: Denise Woodward DOB: May 23, 1919 MRN: [id number]  PRIMARY CARE PROVIDER: Rodrigo Ran, MD  RESPONSIBLE PARTY: Denise Woodward (daughter) Acct ID - Guarantor Home Phone Work Phone Relationship Acct Type  [id number] Denise Woodward, [name] [phone]  Self P/F     [address] [place], [place] [address]   Due to the COVID-19 crisis, this virtual check-in visit was done via telephone from my office and it was initiated and consent by this patient and or family.  PLAN OF CARE and INTERVENTION:  1. ADVANCE CARE PLANNING/GOALS OF CARE: Goal is for patient to remain in the home with her daughter. She has a DNR. 2. PATIENT/CAREGIVER EDUCATION: Symptom management 3. DISEASE STATUS: Virtual check-in visit completed via telephone. Daughter says that patient is not experiencing any pain or shortness of breath. She continues to sleep for about 1-2 days, then is awake about the same amount of time. During her periods of wakefulness she is very talkative. She is total care for all ADLs. She is transferred to her wheelchair with 2 person assistance to the recliner down the hall on days she is more alert. Her intake remains good along with her fluid intake. No reports of dysphagia. Patient was having issues with ear pain due to an excess build-up of wax. Denise Woodward has a friend who is an ENT who was able to remove the wax and patient is no longer experiencing any pain. She is incontinent of both bowel and bladder. No skin breakdown reported. Privately hired Charity fundraiser continues to assist family with patient 40 hours per week. Will continue to monitor.   HISTORY OF PRESENT ILLNESS: This is a 87 yo female with a h/o dementia, HTN, DM II, hyperlipidemia and hypothyroidism. Palliative care teamcontinues to follow patient and will visit monthly and PRN.    CODE STATUS: DNR  ADVANCED DIRECTIVES: Y MOST FORM: no  PPS: 30%   (Duration of visit and documentation 20  minutes)   Candiss Norse, RN BSN

## 2020-05-19 ENCOUNTER — Telehealth: Payer: Self-pay | Admitting: *Deleted

## 2020-05-19 NOTE — Telephone Encounter (Signed)
Received a call from patient's daughter, [name], stating that patient is getting weaker and herself and the hired caregivers are having difficulty getting patient up to her wheelchair. She requires 2 person assistance. Patient used to be able to assist some with transfers, but is no longer able to do so. She is requesting information on how to go about getting a Hoyer lift for patient. Explained process on how to obtain a Hoyer lift e.g prescription from MD and face to face progress note within the last 60 days which includes reason patient needs a Nurse, adult, to be faxed to DME company. She is appreciative.

## 2020-05-25 ENCOUNTER — Other Ambulatory Visit: Payer: Self-pay

## 2020-05-25 ENCOUNTER — Other Ambulatory Visit: Payer: Medicare Other | Admitting: *Deleted

## 2020-05-25 DIAGNOSIS — Z515 Encounter for palliative care: Secondary | ICD-10-CM

## 2020-05-26 NOTE — Progress Notes (Signed)
COMMUNITY PALLIATIVE CARE RN NOTE  PATIENT NAME: Denise Woodward DOB: 11-24-1919 MRN: [id number]  PRIMARY CARE PROVIDER: Rodrigo Ran, MD  RESPONSIBLE PARTY: [name] (daughter) Acct ID - Guarantor Home Phone Work Phone Relationship Acct Type  [id number] BEONCA, GIBB* [phone]  Self P/F     [address] [place], [place] [address]   Due to the COVID-19 crisis, this virtual check-in visit was done via telephone from my office and it was initiated and consent by this patient and or family.  PLAN OF CARE and INTERVENTION:  1. ADVANCE CARE PLANNING/GOALS OF CARE: Goal is for caregiver to be able to transfer patient more safely with use of Hoyer lift. She wants patient to remain in the home with her. She has a DNR. 2. PATIENT/CAREGIVER EDUCATION: Symptom management, safe transfers 3. DISEASE STATUS: Virtual check-in visit completed via telephone. Daughter says patient is not showing any physical indicators of pain or complaining of any pain. No breathing issues. The biggest concern is that she is noticing patient is weaker during transfers. She used to be able to bear some weight and assist with transfers, but is now unable. She requires 2 person assistance during all transfers to her wheelchair.  Daughter is concerned that herself and the hired caregiver will hurt their backs. They were able to get an order from Dr. Waynard Edwards for a Michiel Sites lift and [employer] is delivering and setting this up for patient today. She remains total care for all ADLs. She also has ongoing cycles of wakefulness and sleeping that lasts for about 24-48 hours at a time and sometimes longer.  She continues with a good appetite. No swallowing difficulties. Incontinent of bowel and bladder and wears adult briefs. Continues with no skin issues. Will continue to monitor.  HISTORY OF PRESENT ILLNESS: This is a 87 yo female with a h/o dementia, HTN, DM II, hyperlipidemia and hypothyroidism. Palliative care teamcontinues to  follow patient and will visit monthly and PRN.   CODE STATUS: DNR  ADVANCED DIRECTIVES: Y MOST FORM: no PPS: 30%   (Duration of visit and documentation 20 minutes)   Candiss Norse, RN BSN

## 2020-06-15 ENCOUNTER — Other Ambulatory Visit: Payer: Medicare Other | Admitting: *Deleted

## 2020-06-15 ENCOUNTER — Other Ambulatory Visit: Payer: Self-pay

## 2020-06-15 DIAGNOSIS — Z515 Encounter for palliative care: Secondary | ICD-10-CM

## 2020-07-04 NOTE — Progress Notes (Signed)
PATIENT NAME: Denise Woodward DOB: [birth year] MRN: [id number]  PRIMARY CARE PROVIDER: Rodrigo Ran, MD  RESPONSIBLE PARTY: Denise Woodward (daughter) Acct ID - Guarantor Home Phone Work Phone Relationship Acct Type  [id number] Denise, Woodward* [phone]  Self P/F     [address], [place], [place] [address]   Due to the COVID-19 crisis, this virtual check-in visit was done via telephone from my office and it was initiated and consent by this patient and or family.  PLAN OF CARE and INTERVENTION:  1. ADVANCE CARE PLANNING/GOALS OF CARE: Goal is for patient to remain in the home with her daughter. She has a DNR. 2. PATIENT/CAREGIVER EDUCATION: Symptom management, safe transfers, s/s of infection 3. DISEASE STATUS: Virtual check-in visit completed via telephone. No physical indicators of pain noted per daughter. No respiratory issues. She continues to have wake-sleep cycles which last about 24-48 hours at a time. During the time she is awake, she is very talkative. Her daughter and hired caregiver continue to get her up to her wheelchair and recliner on days when she is more awake. Due to progressive weakness, daughter now has a Nurse, adult, which she states is much easier on patient and themselves. Her appetite remains good along with her fluid intake. No issues with dysphagia noted or reported. She remains total care for all ADLs, and is incontinent of both bowel and bladder and wears adult briefs. No skin issues. She is to receive a [employer] Covid-19 vaccine next week in the home. She is no longer having issues with ear pain since her ears have been flushed at home. Will continue to monitor.   HISTORY OF PRESENT ILLNESS: This is a 87 yo female with a h/o dementia, HTN, DM II, hyperlipidemia and hypothyroidism. Palliative care teamcontinues to follow patient and will visit monthly and PRN.    CODE STATUS: DNR  ADVANCED DIRECTIVES: Y MOST FORM: no PPS: 30%   (Duration of visit and  documentation 20 minutes)   Denise Norse, RN BSN

## 2020-07-12 ENCOUNTER — Other Ambulatory Visit: Payer: Medicare Other | Admitting: *Deleted

## 2020-07-12 ENCOUNTER — Other Ambulatory Visit: Payer: Self-pay

## 2020-07-12 DIAGNOSIS — Z515 Encounter for palliative care: Secondary | ICD-10-CM

## 2020-08-02 NOTE — Progress Notes (Signed)
COMMUNITY PALLIATIVE CARE RN NOTE  PATIENT NAME: Denise Woodward DOB: 1919-09-02 MRN: [id number]  PRIMARY CARE PROVIDER: Rodrigo Ran, MD  RESPONSIBLE PARTY: [name] (daughter) Acct ID - Guarantor Home Phone Work Phone Relationship Acct Type  [id number] KATILYN, MILTENBERGER* [phone]  Self P/F     [address] [place], [place] [address]   Due to the COVID-19 crisis, this virtual check-in visit was done via telephone from my office and it was initiated and consent by this patient and or family.  PLAN OF CARE and INTERVENTION:  1. ADVANCE CARE PLANNING/GOALS OF CARE: Goal is for patient to remain in the home with her daughter and avoid hospitalizations. She has a DNR. 2. PATIENT/CAREGIVER EDUCATION: Symptom management, safe transfers 3. DISEASE STATUS: Virtual check-in visit completed via telephone. Daughter states that patient does not show any physical indicators of pain or discomfort. Her breathing is regular. She just recently received her Pfizer [id number] vaccination and did well without any sides effects other than slight arm soreness. Daughter states that the [facility] lift continues to work well and the patient is getting more used to transfers utilizing this since she is now too weak to transfer. They continue to get her up to a recliner on days she is more awake/alert. She continues to have sleep/wake cycles lasting anywhere from 24-48 hours, which has been an ongoing issue. This has not seemed to be distressing to patient in any way. She remains total care for all ADLs. Her intake is good. No signs of dysphagia. She continues to take all of her medications without any difficulty. She is incontinent of both bowel and bladder. No skin issues at this time. Will continue to monitor.  HISTORY OF PRESENT ILLNESS: This is a 87 yo female with a h/o dementia, HTN, DM II, hyperlipidemia and hypothyroidism. Palliative care teamcontinues to follow patient and will visit monthly and PRN.    CODE  STATUS: DNR ADVANCED DIRECTIVES: Y MOST FORM: no PPS: 30%   (Duration of visit and documentation 20 minutes)   Candiss Norse, RN BSN

## 2020-08-11 ENCOUNTER — Other Ambulatory Visit: Payer: Medicare Other | Admitting: *Deleted

## 2020-08-11 ENCOUNTER — Other Ambulatory Visit: Payer: Self-pay

## 2020-08-11 DIAGNOSIS — Z515 Encounter for palliative care: Secondary | ICD-10-CM

## 2020-08-24 NOTE — Progress Notes (Signed)
COMMUNITY PALLIATIVE CARE RN NOTE  PATIENT NAME: Denise Woodward DOB: 12-10-1919 MRN: [id number]  PRIMARY CARE PROVIDER: Rodrigo Ran, MD  RESPONSIBLE PARTY: [name] (daughter) Acct ID - Guarantor Home Phone Work Phone Relationship Acct Type  [id number] CHANAH, TIDMORE* [phone]  Self P/F     [address], [place], [place] [address]   Due to the COVID-19 crisis, this virtual check-in visit was done via telephone from my office and it was initiated and consent by this patient and or family.  PLAN OF CARE and INTERVENTION:  ADVANCE CARE PLANNING/GOALS OF CARE: Goal is for patient to remain in the home with her daughter and avoid hospitalizations. She has a DNR.  PATIENT/CAREGIVER EDUCATION: Symptom management, safe transfers DISEASE STATUS: Virtual check-in visit completed via telephone. Daughter denies any physical indicators of pain noted. She continues to have sleep-wake cycles lasting between 24-48 hours. This has been a chronic issue. Breathing remains regular and unlabored. Denies patient coughing. Her appetite remains good. She continues to take her medications crushed in applesauce without difficulty. She is total care with all ADLs. She continues to be transferred via [facility] lift and this is working well. Daughter and hired caregiver gets patient up to recliner on days when she is more alert and she seems to tolerate this well. She is transported via wheelchair. She is incontinent of both bowel and bladder and wears adult briefs. No skin breakdown or open areas reported. Desitin is applied after each incontinent episode. Will continue to monitor.  HISTORY OF PRESENT ILLNESS:  This is a 87 yo female with a h/o dementia, HTN, DM II, hyperlipidemia and hypothyroidism. Palliative care team continues to follow patient and visits patient monthly and PRN.   CODE STATUS: DNR ADVANCED DIRECTIVES: Y MOST FORM: no PPS: 30%   (Duration of visit and documentation 20 minutes)   Candiss Norse, RN BSN

## 2020-09-14 ENCOUNTER — Other Ambulatory Visit: Payer: Medicare Other | Admitting: *Deleted

## 2020-09-14 ENCOUNTER — Other Ambulatory Visit: Payer: Self-pay

## 2020-09-14 DIAGNOSIS — Z515 Encounter for palliative care: Secondary | ICD-10-CM

## 2020-10-10 NOTE — Progress Notes (Signed)
COMMUNITY PALLIATIVE CARE RN NOTE  PATIENT NAME: Denise Woodward DOB: 05/26/1919 MRN: [id number]  PRIMARY CARE PROVIDER: Rodrigo Ran, MD  RESPONSIBLE PARTY: [name] (daughter) Acct ID - Guarantor Home Phone Work Phone Relationship Acct Type  [id number] AMENDA, DUCLOS* [phone]  Self P/F     [address], [place], [place] [address]   Due to the COVID-19 crisis, this virtual check-in visit was done via telephone from my office and it was initiated and consent by this patient and or family.  PLAN OF CARE and INTERVENTION:  ADVANCE CARE PLANNING/GOALS OF CARE: Goal is for patient to remain in the home with her daughter and avoid hospitalizations. She has a DNR. PATIENT/CAREGIVER EDUCATION: Symptom management, safe transfers DISEASE STATUS: Daughter reports that patient shows no physical indicators of pain. She continues with wake/sleep cycles lasting 24-48 hours. While she is awake she is continually talking. She continues on Depakote for visual hallucinations which is helpful. No reports of shortness of breath. She remains total care for all ADLs. She is able to feed herself finger foods. Family checks her blood sugars only if she has had a change of condition. She is transferred via [facility] lift to her wheelchair and transported this way since she is too weak to stand anymore, even with 2 person assistance. On her awake days they continue to place her in a recliner for several hours. Her appetite is good. No issues with swallowing. She continues to take her medications crushed in applesauce without difficulty. They continue with a hired caregiver 40 hours/week. No skin issues reported at this time.   HISTORY OF PRESENT ILLNESS: This is a 87 yo female with a h/o dementia, HTN, DM II, hyperlipidemia and hypothyroidism. Palliative care team continues to follow patient for additional support.  CODE STATUS: DNR ADVANCED DIRECTIVES: Y MOST FORM: no PPS: 30%   (Duration of visit and  documentation 20 minutes)   Candiss Norse, RN BSN

## 2020-10-14 ENCOUNTER — Other Ambulatory Visit: Payer: Medicare Other | Admitting: *Deleted

## 2020-10-14 ENCOUNTER — Other Ambulatory Visit: Payer: Self-pay

## 2020-10-14 DIAGNOSIS — Z515 Encounter for palliative care: Secondary | ICD-10-CM

## 2020-10-18 NOTE — Progress Notes (Signed)
[facility] RN NOTE  PATIENT NAME: Denise Woodward DOB: May 25, 1919 MRN: [id number]  PRIMARY CARE PROVIDER: Rodrigo Ran, MD  RESPONSIBLE PARTY: [name] (daughter) Acct ID - Guarantor Home Phone Work Phone Relationship Acct Type  [id number] MERLE, WHITEHORN* [phone]  Self P/F     [address], [place], [place] [address]   Due to the COVID-19 crisis, this virtual check-in visit was done via telephone from my office and it was initiated and consent by this patient and or family.  PLAN OF CARE and INTERVENTION:  ADVANCE CARE PLANNING/GOALS OF CARE: Goal is for patient to remain in the home with her daughter and avoid hospitalizations. She has a DNR. PATIENT/CAREGIVER EDUCATION: Symptom management, safe transfers, s/s of infection DISEASE STATUS: Virtual check-in-visit completed via telephone. Daughter denies that patient experiences in pain. No breathing issues or coughing noted. She continues with ongoing sleep/wake cycles lasting 24-48 hours. She does have some visual hallucinations that do not appear to be stressful to her. She continues on Depakote for this. Her appetite remains good. She is able to feed herself finger foods, but unable to manipulate utensils. She takes her medications crushed in applesauce. She is total care with ADLs. She is transferred via [facility] lift  and transported via wheelchair. During her days when she is awake, daughter continues to get patient up into a recliner for several hours. No skin issues at this time. She has a hired caregiver for 40 hours per week to assist patient with personal care needs. She feels overall that her condition is stable and has no concerns at this time.   HISTORY OF PRESENT ILLNESS: This is a 87 yo female with a h/o dementia, HTN, DM II, hyperlipidemia and hypothyroidism. Palliative care team continues to follow patient for additional support.   CODE STATUS: DNR   ADVANCED DIRECTIVES: Y MOST FORM: no PPS:  30%   (Duration of visit and documentation 15 minutes)   Candiss Norse, RN BSN

## 2020-11-23 ENCOUNTER — Other Ambulatory Visit: Payer: Self-pay

## 2020-11-23 ENCOUNTER — Other Ambulatory Visit: Payer: Medicare Other | Admitting: *Deleted

## 2020-11-23 DIAGNOSIS — Z515 Encounter for palliative care: Secondary | ICD-10-CM

## 2020-11-23 NOTE — Progress Notes (Signed)
[facility] RN NOTE  PATIENT NAME: Denise Woodward DOB: Nov 25, 1919 MRN: [id number]  PRIMARY CARE PROVIDER: Rodrigo Ran, MD  RESPONSIBLE PARTY: [name] (daughter) Acct ID - Guarantor Home Phone Work Phone Relationship Acct Type  [id number] ALLIENE, KLUGH* [phone]  Self P/F     [address], [place], [place] [address]   Due to the COVID-19 crisis, this virtual check-in visit was done via telephone from my office and it was initiated and consent by this patient and or family.  PLAN OF CARE and INTERVENTION:  ADVANCE CARE PLANNING/GOALS OF CARE: Goal is for patient to remain in the home with her daughter. She has a DNR. PATIENT/CAREGIVER EDUCATION: Symptom management, safe transfers DISEASE STATUS: Follow-up virtual check-in visit completed via telephone. [name] denies patient exhibiting any physical indicators of pain. Her breathing is regular and unlabored. Denies patient coughing. Patient remains continuously confused. She continues with sleep/wake cycles lasting 24-48 hours. This does not seem to distress the patient. Visual hallucinations well controlled with Depakote. She is total care with all ADLs, but able to feed herself finger foods. She is transferred via [facility] lift. They continue to sit her up in the recliner for a few hours when able. Her appetite is good. No dysphagia. Her medications are given crushed in applesauce. No active skin issues at time. She is a diabetic, but blood sugars are only checked if she has a change in her condition. She is incontinent of both bowel and bladder and wears adult briefs. Daughter feels that overall patient's condition is stable and has no concerns at this time.  HISTORY OF PRESENT ILLNESS: This is a 87 yo female with a h/o dementia, HTN, DM II, hyperlipidemia and hypothyroidism. Palliative care team continues to follow patient for assistance with symptom management, goals of care and complex decision making.   CODE  STATUS: DNR ADVANCED DIRECTIVES: Y MOST FORM: no PPS: 30%   (Duration of visit and documention 15 minutes)   Denise Norse, RN BSN

## 2020-12-21 ENCOUNTER — Other Ambulatory Visit: Payer: Self-pay

## 2020-12-21 ENCOUNTER — Other Ambulatory Visit: Payer: Medicare Other | Admitting: *Deleted

## 2020-12-21 DIAGNOSIS — Z515 Encounter for palliative care: Secondary | ICD-10-CM

## 2020-12-21 NOTE — Progress Notes (Signed)
[facility] RN NOTE  PATIENT NAME: Denise Woodward DOB: 1919/09/11 MRN: [id number]  PRIMARY CARE PROVIDER: Rodrigo Ran, MD  RESPONSIBLE PARTY: [name] (daughter) Acct ID - Guarantor Home Phone Work Phone Relationship Acct Type  [id number] INGRA, ROTHER* [phone]  Self P/F     [address], [place], [place] [address]   Due to the COVID-19 crisis, this virtual check-in visit was done via telephone from my office and it was initiated and consent by this patient and or family.  PLAN OF CARE and INTERVENTION:  ADVANCE CARE PLANNING/GOALS OF CARE: Goal is for patient to remain in the home with her daughter. She has a DNR. PATIENT/CAREGIVER EDUCATION: Symptom management, s/s of infection DISEASE STATUS: Virtual check-in visit completed with patient's daughter, [name], via telephone. [name] reports that her mom started with a cough on 12/19/20. She herself has been sick with a cough and heavy chest congestion since 12/16/20. [name] took 2 in-home Covid tests which were negative but has not tested the patient. Patient's voice is raspy. She has a non-productive cough, as she is too weak to expectorate. She is afebrile. Her oxygen level is 89% on room air but is not short of breath. She has upper chest congestion. [name] is concerned that this may turn into pneumonia since she is unable to cough up phlegm. [name] has been giving her Safetussin to help with her cough. Her appetite has been good. She continues with sleep-awake cycles for 24-48 hours. She was in her awake stage for 48 hours starting 12/21/20 and finally fell asleep around 2am. She is pushing fluids. I called and spoke with Morrie Sheldon, nurse with Dr. Laurey Morale office to advise of patient's condition. She advised that Dr. Waynard Edwards ordered Cefdinir 300 mg po twice daily x 7 days. She is to also take an over the counter probiotic daily for the next 3-4 weeks. They would like for daughter to give patient an in-home Covid test and  call them back with results whether positive or negative. He also advised that it is ok for her to give Mucinex DM (patient already taking Safetussin DM for those with high blood pressure and diabetes) and Tylenol as needed. I also made Morrie Sheldon aware to add Doxycyline to patient's allergy list as in the past it caused tongue and lip swelling when it was used for an abscess patient had on her left thigh some time ago. Morrie Sheldon has added this. Made daughter aware of new orders. She verbalized understanding and is appreciative.   HISTORY OF PRESENT ILLNESS:  This is a 87 yo female with a h/o dementia, HTN, DM II, hyperlipidemia and hypothyroidism. Palliative care team continues to follow patient for assistance with symptom management, goals of care and complex decision making.    CODE STATUS: DNR   ADVANCED DIRECTIVES: Y MOST FORM: no PPS: 30%   (Duration of visit and documentation 30 minutes)   Candiss Norse, RN BSN

## 2020-12-23 ENCOUNTER — Emergency Department (HOSPITAL_COMMUNITY): Payer: Medicare Other

## 2020-12-23 ENCOUNTER — Inpatient Hospital Stay (HOSPITAL_COMMUNITY)
Admission: EM | Admit: 2020-12-23 | Discharge: 2020-12-27 | DRG: 388 | Disposition: A | Discharge status: Home / Self Care | Payer: Medicare Other | Attending: Internal Medicine | Admitting: Internal Medicine

## 2020-12-23 ENCOUNTER — Other Ambulatory Visit: Payer: Self-pay

## 2020-12-23 ENCOUNTER — Encounter (HOSPITAL_COMMUNITY): Payer: Self-pay | Admitting: Internal Medicine

## 2020-12-23 ENCOUNTER — Other Ambulatory Visit: Payer: Medicare Other | Admitting: *Deleted

## 2020-12-23 ENCOUNTER — Encounter: Payer: Self-pay | Admitting: Internal Medicine

## 2020-12-23 VITALS — BP 155/80 | HR 64 | Temp 98.0°F | Resp 18

## 2020-12-23 DIAGNOSIS — K8020 Calculus of gallbladder without cholecystitis without obstruction: Secondary | ICD-10-CM | POA: Diagnosis present

## 2020-12-23 DIAGNOSIS — E039 Hypothyroidism, unspecified: Secondary | ICD-10-CM | POA: Diagnosis present

## 2020-12-23 DIAGNOSIS — Z66 Do not resuscitate: Secondary | ICD-10-CM | POA: Diagnosis present

## 2020-12-23 DIAGNOSIS — Z794 Long term (current) use of insulin: Secondary | ICD-10-CM

## 2020-12-23 DIAGNOSIS — H9190 Unspecified hearing loss, unspecified ear: Secondary | ICD-10-CM | POA: Diagnosis present

## 2020-12-23 DIAGNOSIS — R404 Transient alteration of awareness: Secondary | ICD-10-CM | POA: Diagnosis not present

## 2020-12-23 DIAGNOSIS — R0902 Hypoxemia: Secondary | ICD-10-CM | POA: Diagnosis not present

## 2020-12-23 DIAGNOSIS — Z8249 Family history of ischemic heart disease and other diseases of the circulatory system: Secondary | ICD-10-CM

## 2020-12-23 DIAGNOSIS — Z79890 Hormone replacement therapy (postmenopausal): Secondary | ICD-10-CM

## 2020-12-23 DIAGNOSIS — E785 Hyperlipidemia, unspecified: Secondary | ICD-10-CM | POA: Diagnosis present

## 2020-12-23 DIAGNOSIS — F0390 Unspecified dementia without behavioral disturbance: Secondary | ICD-10-CM | POA: Diagnosis present

## 2020-12-23 DIAGNOSIS — E1169 Type 2 diabetes mellitus with other specified complication: Secondary | ICD-10-CM | POA: Diagnosis present

## 2020-12-23 DIAGNOSIS — Z7401 Bed confinement status: Secondary | ICD-10-CM

## 2020-12-23 DIAGNOSIS — K56609 Unspecified intestinal obstruction, unspecified as to partial versus complete obstruction: Secondary | ICD-10-CM | POA: Diagnosis present

## 2020-12-23 DIAGNOSIS — R112 Nausea with vomiting, unspecified: Secondary | ICD-10-CM

## 2020-12-23 DIAGNOSIS — K56600 Partial intestinal obstruction, unspecified as to cause: Principal | ICD-10-CM | POA: Diagnosis present

## 2020-12-23 DIAGNOSIS — K439 Ventral hernia without obstruction or gangrene: Secondary | ICD-10-CM | POA: Diagnosis not present

## 2020-12-23 DIAGNOSIS — R531 Weakness: Secondary | ICD-10-CM | POA: Diagnosis not present

## 2020-12-23 DIAGNOSIS — R1084 Generalized abdominal pain: Secondary | ICD-10-CM | POA: Diagnosis not present

## 2020-12-23 DIAGNOSIS — F03C0 Unspecified dementia, severe, without behavioral disturbance, psychotic disturbance, mood disturbance, and anxiety: Secondary | ICD-10-CM | POA: Insufficient documentation

## 2020-12-23 DIAGNOSIS — R739 Hyperglycemia, unspecified: Secondary | ICD-10-CM | POA: Diagnosis not present

## 2020-12-23 DIAGNOSIS — Z515 Encounter for palliative care: Secondary | ICD-10-CM

## 2020-12-23 DIAGNOSIS — R1111 Vomiting without nausea: Secondary | ICD-10-CM | POA: Diagnosis not present

## 2020-12-23 DIAGNOSIS — E119 Type 2 diabetes mellitus without complications: Secondary | ICD-10-CM | POA: Insufficient documentation

## 2020-12-23 DIAGNOSIS — N179 Acute kidney failure, unspecified: Secondary | ICD-10-CM | POA: Diagnosis not present

## 2020-12-23 DIAGNOSIS — K6389 Other specified diseases of intestine: Secondary | ICD-10-CM | POA: Diagnosis not present

## 2020-12-23 DIAGNOSIS — Z20822 Contact with and (suspected) exposure to covid-19: Secondary | ICD-10-CM | POA: Diagnosis present

## 2020-12-23 DIAGNOSIS — R532 Functional quadriplegia: Secondary | ICD-10-CM | POA: Diagnosis not present

## 2020-12-23 DIAGNOSIS — R059 Cough, unspecified: Secondary | ICD-10-CM | POA: Diagnosis not present

## 2020-12-23 DIAGNOSIS — I152 Hypertension secondary to endocrine disorders: Secondary | ICD-10-CM | POA: Diagnosis present

## 2020-12-23 DIAGNOSIS — K5730 Diverticulosis of large intestine without perforation or abscess without bleeding: Secondary | ICD-10-CM | POA: Diagnosis not present

## 2020-12-23 DIAGNOSIS — Z79899 Other long term (current) drug therapy: Secondary | ICD-10-CM

## 2020-12-23 DIAGNOSIS — Z85038 Personal history of other malignant neoplasm of large intestine: Secondary | ICD-10-CM | POA: Diagnosis not present

## 2020-12-23 DIAGNOSIS — N289 Disorder of kidney and ureter, unspecified: Secondary | ICD-10-CM

## 2020-12-23 DIAGNOSIS — Z9049 Acquired absence of other specified parts of digestive tract: Secondary | ICD-10-CM

## 2020-12-23 DIAGNOSIS — J189 Pneumonia, unspecified organism: Secondary | ICD-10-CM | POA: Diagnosis not present

## 2020-12-23 DIAGNOSIS — E1159 Type 2 diabetes mellitus with other circulatory complications: Secondary | ICD-10-CM | POA: Insufficient documentation

## 2020-12-23 DIAGNOSIS — Z833 Family history of diabetes mellitus: Secondary | ICD-10-CM

## 2020-12-23 HISTORY — DX: Unspecified dementia, severe, without behavioral disturbance, psychotic disturbance, mood disturbance, and anxiety: F03.C0

## 2020-12-23 HISTORY — DX: Type 2 diabetes mellitus with other specified complication: E11.69

## 2020-12-23 HISTORY — DX: Type 2 diabetes mellitus with other circulatory complications: E11.59

## 2020-12-23 HISTORY — DX: Hypertension secondary to endocrine disorders: I15.2

## 2020-12-23 HISTORY — DX: Hypothyroidism, unspecified: E03.9

## 2020-12-23 HISTORY — DX: Type 2 diabetes mellitus without complications: E11.9

## 2020-12-23 LAB — LACTIC ACID, PLASMA
Lactic Acid, Venous: 4.1 mmol/L (ref 0.5–1.9)
Lactic Acid, Venous: 2.8 mmol/L (ref 0.5–1.9)

## 2020-12-23 LAB — URINALYSIS, ROUTINE W REFLEX MICROSCOPIC
Bacteria, UA: NONE SEEN
Bilirubin Urine: NEGATIVE
Glucose, UA: 50 mg/dL — AB
Ketones, ur: 5 mg/dL — AB
Leukocytes,Ua: NEGATIVE
Nitrite: NEGATIVE
Protein, ur: 30 mg/dL — AB
Specific Gravity, Urine: 1.042 — ABNORMAL HIGH (ref 1.005–1.030)
pH: 5 (ref 5.0–8.0)

## 2020-12-23 LAB — COMPREHENSIVE METABOLIC PANEL
ALT: 15 U/L (ref 0–44)
AST: 22 U/L (ref 15–41)
Albumin: 3.7 g/dL (ref 3.5–5.0)
Alkaline Phosphatase: 48 U/L (ref 38–126)
Anion gap: 15 (ref 5–15)
BUN: 38 mg/dL — ABNORMAL HIGH (ref 8–23)
CO2: 30 mmol/L (ref 22–32)
Calcium: 9.7 mg/dL (ref 8.9–10.3)
Chloride: 95 mmol/L — ABNORMAL LOW (ref 98–111)
Creatinine, Ser: 1.5 mg/dL — ABNORMAL HIGH (ref 0.44–1.00)
GFR, Estimated: 31 mL/min — ABNORMAL LOW (ref >60)
Glucose, Bld: 195 mg/dL — ABNORMAL HIGH (ref 70–99)
Potassium: 4.1 mmol/L (ref 3.5–5.1)
Sodium: 140 mmol/L (ref 135–145)
Total Bilirubin: 0.5 mg/dL (ref 0.3–1.2)
Total Protein: 7.7 g/dL (ref 6.5–8.1)

## 2020-12-23 LAB — CBC WITH DIFFERENTIAL/PLATELET
Abs Immature Granulocytes: 0.06 10*3/uL (ref 0.00–0.07)
Basophils Absolute: 0 10*3/uL (ref 0.0–0.1)
Basophils Relative: 0 %
Eosinophils Absolute: 0 10*3/uL (ref 0.0–0.5)
Eosinophils Relative: 0 %
HCT: 41 % (ref 36.0–46.0)
Hemoglobin: 13.5 g/dL (ref 12.0–15.0)
Immature Granulocytes: 1 %
Lymphocytes Relative: 17 %
Lymphs Abs: 1.9 10*3/uL (ref 0.7–4.0)
MCH: 31.6 pg (ref 26.0–34.0)
MCHC: 32.9 g/dL (ref 30.0–36.0)
MCV: 96 fL (ref 80.0–100.0)
Monocytes Absolute: 1 10*3/uL (ref 0.1–1.0)
Monocytes Relative: 9 %
Neutro Abs: 8.2 10*3/uL — ABNORMAL HIGH (ref 1.7–7.7)
Neutrophils Relative %: 73 %
Platelets: 326 10*3/uL (ref 150–400)
RBC: 4.27 MIL/uL (ref 3.87–5.11)
RDW: 13.8 % (ref 11.5–15.5)
WBC: 11.2 10*3/uL — ABNORMAL HIGH (ref 4.0–10.5)
nRBC: 0 % (ref 0.0–0.2)

## 2020-12-23 LAB — RESP PANEL BY RT-PCR (FLU A&B, COVID) ARPGX2
Influenza A by PCR: NEGATIVE
Influenza B by PCR: NEGATIVE
SARS Coronavirus 2 by RT PCR: NEGATIVE

## 2020-12-23 LAB — LIPASE, BLOOD: Lipase: 26 U/L (ref 11–51)

## 2020-12-23 MED ORDER — ONDANSETRON HCL 4 MG/2ML IJ SOLN
INTRAMUSCULAR | Status: AC
  Administered 2020-12-23: 4 mg via INTRAVENOUS
  Filled 2020-12-23: qty 2

## 2020-12-23 MED ORDER — SODIUM CHLORIDE 0.9 % IV BOLUS
1000.0000 mL | Freq: Once | INTRAVENOUS | Status: AC
  Administered 2020-12-23: 1000 mL via INTRAVENOUS

## 2020-12-23 MED ORDER — ONDANSETRON HCL 4 MG/2ML IJ SOLN: 4.0000 mg | Freq: Once | INTRAMUSCULAR | Status: AC

## 2020-12-23 MED ORDER — IOHEXOL 300 MG/ML  SOLN
60.0000 mL | Freq: Once | INTRAMUSCULAR | Status: AC | PRN
  Administered 2020-12-23: 60 mL via INTRAVENOUS

## 2020-12-23 NOTE — Progress Notes (Signed)
[facility] RN NOTE  PATIENT NAME: Denise Woodward DOB: 06-20-1919 MRN: [id number]  PRIMARY CARE PROVIDER: Rodrigo Ran, MD  RESPONSIBLE PARTY: [name] (daughter) Acct ID - Guarantor Home Phone Work Phone Relationship Acct Type  [id number] LAURELLA, TULL* [phone]  Self P/F     [address] [place], [place] [address]   Covid-19 Pre-screening Negative  PLAN OF CARE and INTERVENTION:  ADVANCE CARE PLANNING/GOALS OF CARE: Goal is for patient to remain in the home with her daughter. She has a DNR. PATIENT/CAREGIVER EDUCATION: Symptom management DISEASE STATUS: Follow-up palliative care visit made per daughter's request. Patient was recently started on Cefdinir twice daily for a suspected upper respiratory infection. She has also been taking a daily probiotic and Safetussin as needed 2 days ago. Yesterday she started having episodes of nausea/vomiting x 4 starting at 4:45 pm. It appeared to be dark bile. She has been giving this antibiotic with food. Her last BM was last night at 10pm. Also last night, patient stopped wanting to eat or drink anything. This morning she has started back taking in sips of fluids. Lungs are clear today with auscultation. Daughter suspects that most of the mucus came out when she vomited. I suspect that the antibiotic may be upsetting her stomach as nausea/vomiting didn't start until after starting this medication. I recommended that she stop taking the antibiotic for now and focus on giving patient a liquid diet today and to progress to her regular diet if tolerating liquids ok. I have left a message with Dr. Laurey Morale nurse Morrie Sheldon for further instructions on this. No coughing noted during visit today. Daughter did give patient an in-home Covid test and it was negative. Patient also has some small dark pink areas noted to both sides of her buttocks. On the right side, areas appears that a layer of skin is missing, but does not appear to be  infected. Areas on the left buttocks are closed. Recommended that she apply Desitin for protection as patient is incontinent of both bowel and bladder. Awaiting return call from PCP's office. Daughter is appreciative of visit.  HISTORY OF PRESENT ILLNESS:  This is a 87 yo female with a h/o dementia, HTN, DM II, hyperlipidemia and hypothyroidism. Palliative care team continues to follow patient for assistance with symptom management, goals of care and complex decision making.     CODE STATUS: DNR ADVANCED DIRECTIVES: Y MOST FORM: no PPS: 30%   PHYSICAL EXAM:   VITALS: Today's Vitals   12/23/20 1134  BP: (!) 155/80  Pulse: 64  Resp: 18  Temp: 98 F (36.7 C)  TempSrc: Temporal  SpO2: 92%  PainSc: 0-No pain    LUNGS: clear to auscultation  CARDIAC: Cor RRR EXTREMITIES: No edema SKIN:  See above note   NEURO:  Alert but nonverbal throughout visit, hard of hearing, transferred via [facility] lift, total care   (Duration of visit and documentation 45 minutes)   Candiss Norse, RN BSN

## 2020-12-23 NOTE — ED Triage Notes (Signed)
PT presents today with her daughter who she has lived with for the past 20+ years. Pt is total care on palliative care and mostly non-verbal at baseline. Per daughter pt was started on a new abx 2 days ago and has been vomiting dark liquid vomit x5 times in the last day. Pt was advised to come to ED for IVF and Zofran per MD.

## 2020-12-23 NOTE — ED Notes (Signed)
Pt placed on 4 L [place]  

## 2020-12-23 NOTE — ED Provider Notes (Signed)
[facility] EMERGENCY DEPARTMENT Provider Note   CSN: [id number] Arrival date & time: 12/23/20  1622     History Chief Complaint  Patient presents with   Nausea   Emesis    Denise Woodward is a 87 y.o. female.  The history is provided by the patient and medical records. No language interpreter was used.  Emesis Severity:  Moderate Duration:  1 day Timing:  Constant Quality:  Stomach contents Progression:  Unchanged Recent urination:  Normal Relieved by:  Nothing Worsened by:  Nothing Ineffective treatments:  None tried Associated symptoms: abdominal pain and cough   Associated symptoms: no arthralgias, no chills, no diarrhea, no fever and no headaches   Risk factors: prior abdominal surgery       No past medical history on file.  There are no problems to display for this patient.   Past Surgical History:  Procedure Laterality Date   PARTIAL COLECTOMY       OB History   No obstetric history on file.     No family history on file.     Home Medications Prior to Admission medications   Not on File    Allergies    Patient has no allergy information on record.  Review of Systems   Review of Systems  Constitutional:  Positive for fatigue. Negative for chills, diaphoresis and fever.  HENT:  Negative for congestion.   Respiratory:  Positive for cough. Negative for chest tightness, shortness of breath and wheezing.   Cardiovascular:  Negative for chest pain, palpitations and leg swelling.  Gastrointestinal:  Positive for abdominal pain, constipation, nausea and vomiting. Negative for diarrhea.  Genitourinary:  Negative for dysuria and flank pain.  Musculoskeletal:  Negative for arthralgias, neck pain and neck stiffness.  Skin:  Negative for rash and wound.  Neurological:  Negative for dizziness, weakness, light-headedness, numbness and headaches.  Psychiatric/Behavioral:  Negative for agitation and confusion.   All other systems  reviewed and are negative.  Physical Exam Updated Vital Signs BP (!) 151/72 (BP Location: Left Arm)   Pulse 63   Temp 98.7 F (37.1 C) (Oral)   Resp 20   SpO2 92%   Physical Exam Vitals and nursing note reviewed.  Constitutional:      General: She is not in acute distress.    Appearance: She is well-developed. She is not ill-appearing, toxic-appearing or diaphoretic.  HENT:     Head: Normocephalic and atraumatic.     Nose: No congestion.     Mouth/Throat:     Mouth: Mucous membranes are dry.  Eyes:     Extraocular Movements: Extraocular movements intact.     Conjunctiva/sclera: Conjunctivae normal.     Pupils: Pupils are equal, round, and reactive to light.  Cardiovascular:     Rate and Rhythm: Normal rate and regular rhythm.     Heart sounds: No murmur heard. Pulmonary:     Effort: Pulmonary effort is normal. No respiratory distress.     Breath sounds: Normal breath sounds. No wheezing, rhonchi or rales.  Chest:     Chest wall: No tenderness.  Abdominal:     General: Abdomen is flat.     Palpations: Abdomen is soft.     Tenderness: There is abdominal tenderness. There is no guarding or rebound.  Musculoskeletal:        General: No tenderness.     Cervical back: Neck supple. No tenderness.  Skin:    General: Skin is warm and dry.  Capillary Refill: Capillary refill takes less than 2 seconds.     Findings: No erythema.  Neurological:     General: No focal deficit present.     Mental Status: She is alert.     Sensory: No sensory deficit.     Motor: No weakness.  Psychiatric:        Mood and Affect: Mood normal.    ED Results / Procedures / Treatments   Labs (all labs ordered are listed, but only abnormal results are displayed) Labs Reviewed  CBC WITH DIFFERENTIAL/PLATELET - Abnormal; Notable for the following components:      Result Value   WBC 11.2 (*)    Neutro Abs 8.2 (*)    All other components within normal limits  COMPREHENSIVE METABOLIC PANEL -  Abnormal; Notable for the following components:   Chloride 95 (*)    Glucose, Bld 195 (*)    BUN 38 (*)    Creatinine, Ser 1.50 (*)    GFR, Estimated 31 (*)    All other components within normal limits  LACTIC ACID, PLASMA - Abnormal; Notable for the following components:   Lactic Acid, Venous 4.1 (*)    All other components within normal limits  LACTIC ACID, PLASMA - Abnormal; Notable for the following components:   Lactic Acid, Venous 2.8 (*)    All other components within normal limits  URINALYSIS, ROUTINE W REFLEX MICROSCOPIC - Abnormal; Notable for the following components:   Specific Gravity, Urine 1.042 (*)    Glucose, UA 50 (*)    Hgb urine dipstick SMALL (*)    Ketones, ur 5 (*)    Protein, ur 30 (*)    All other components within normal limits  RESP PANEL BY RT-PCR (FLU A&B, COVID) ARPGX2  LIPASE, BLOOD  POC OCCULT BLOOD, ED    EKG None  Radiology CT ABDOMEN PELVIS W CONTRAST  Result Date: 12/23/2020 CLINICAL DATA:  Bowel obstruction suspected. Colon cancer status post surgery EXAM: CT ABDOMEN AND PELVIS WITH CONTRAST TECHNIQUE: Multidetector CT imaging of the abdomen and pelvis was performed using the standard protocol following bolus administration of intravenous contrast. CONTRAST:  43mL OMNIPAQUE IOHEXOL 300 MG/ML  SOLN COMPARISON:  None. FINDINGS: Lower chest: Bilateral lower lobe atelectasis. Bronchial wall thickening. Hepatobiliary: No focal liver abnormality. Calcified gallstones noted layering within the gallbladder lumen. No gallbladder wall thickening or pericholecystic fluid. No biliary dilatation. Pancreas: No focal lesion. Normal pancreatic contour. No surrounding inflammatory changes. No main pancreatic ductal dilatation. Spleen: Normal in size without focal abnormality. Adrenals/Urinary Tract: No adrenal nodule bilaterally. Bilateral kidneys enhance symmetrically. No hydronephrosis. No hydroureter. The urinary bladder is grossly unremarkable with limited  evaluation due to streak artifact. Stomach/Bowel: Surgical changes related to sigmoid resection. Stomach is within normal limits. Several loops of proximal mid small bowel mildly distended/dilated with fluid. No associated transition point. No small bowel thickening. No pneumatosis. No evidence of large bowel wall thickening or dilatation. Scattered colonic diverticulosis. The appendix is not definitely identified and may be surgically absent. Vascular/Lymphatic: No abdominal aorta or iliac aneurysm. Mild atherosclerotic plaque of the aorta and its branches. No abdominal, pelvic, or inguinal lymphadenopathy. Reproductive: Uterus and bilateral adnexa are unremarkable. Nonspecific left adnexal region calcification. Other: No intraperitoneal free fluid. No intraperitoneal free gas. No organized fluid collection. Musculoskeletal: Moderate to large volume fat containing infraumbilical lower abdominal ventral wall hernia. Small fat containing right inguinal hernia. No suspicious lytic or blastic osseous lesions. No acute displaced fracture. Grade 1 anterolisthesis of L4  on L5. Bilateral total hip arthroplasties partially visualized. Other: Limited evaluation of the pelvis due to streak artifact originating from bilateral surgical femoral hardware. IMPRESSION: 1. Several loops of proximal mid small bowel mildly distended/dilated with fluid. No transition point identified. Finding could represent an ileus versus developing early/partial small bowel obstruction. 2. Moderate to large volume fat containing infraumbilical lower abdominal ventral wall hernia. No associated findings to suggest ischemia or cause of a bowel obstruction. 3. Scattered colonic diverticulosis with no acute diverticulitis in a patient status post sigmoid resection. 4. Cholelithiasis no findings of acute cholecystitis. Electronically Signed   By: Tish Frederickson M.D.   On: 12/23/2020 20:52   DG Chest Portable 1 View  Result Date: 12/23/2020 CLINICAL  DATA:  Cough, on cefdinir to prevent pneumonia, now nausea, vomiting, abdominal pain. Concern for either aspiration, bowel obstruction or pneumonia EXAM: PORTABLE CHEST 1 VIEW COMPARISON:  None. FINDINGS: Abnormal aortic arch contour likely due to positioning. Otherwise the heart and mediastinal contours are within normal limits. Limited evaluation of the left base due to patient positioning. No focal consolidation. No pulmonary edema. No pleural effusion. No pneumothorax. No acute osseous abnormality. IMPRESSION: 1. Abnormal aortic arch contour likely due to positioning. 2. Limited evaluation of the left base due to patient positioning. 3. Recommend repeat PA and lateral view of the chest for further evaluation. Electronically Signed   By: Tish Frederickson M.D.   On: 12/23/2020 18:30    Procedures Procedures   Medications Ordered in ED Medications  sodium chloride 0.9 % bolus 1,000 mL (0 mLs Intravenous Stopped 12/23/20 1906)  ondansetron (ZOFRAN) injection 4 mg (4 mg Intravenous Given 12/23/20 1755)  iohexol (OMNIPAQUE) 300 MG/ML solution 60 mL (60 mLs Intravenous Contrast Given 12/23/20 2008)    ED Course  I have reviewed the triage vital signs and the nursing notes.  Pertinent labs & imaging results that were available during my care of the patient were reviewed by me and considered in my medical decision making (see chart for details).    MDM Rules/Calculators/A&P                           Denise Woodward is a 87 y.o. female with a history of dementia, hypertension, hyperlipidemia, hypothyroidism, previous colon cancer status post abdominal surgery, and known abdominal hernia who presents with 2 days of abdominal pain, nausea, 5 episodes of dark liquid emesis, and fatigue.  Family reports that earliest week patient had some URI symptoms and was started on cefdinir orally and had a negative COVID home test.  Patient has been on the antibiotic for a day or 2 when she started having more  abdominal pain yesterday.  Patient started having nausea and vomiting and has had dark liquid emesis that family was concerned and was either blood or bile.  She also normally has many bowel movements a day but is only had 1 today which is abnormal for her.  Patient is nonverbal for my exam.  On exam, lungs are clear and chest is nontender.  Abdomen is tender diffusely.  Bowel sounds were appreciated.  No evidence of acute trauma on my exam.  Patient has pulses in extremities.  Clinically given her history of bowel surgery, colon cancer, decreased bowel movements, increased vomiting, and the tenderness on exam, I am concerned about bowel obstruction.  We will get a CT scan as well as give her fluids and labs.  Patient did appear dehydrated on  exam with dry mucous membranes.  We will get a COVID test given the recent URI symptoms and a chest x-ray.  Anticipate reassessment after work-up to determine disposition.  Also of note, patient has been seen by palliative care and was seen today when they decide to have her come in for assessment and hydration.  She is DNR and family agrees would likely not be a good surgical candidate if significant findings are discovered however they agree that it is reasonable to look to make sure there is not a large obstruction that would change management.      9:25 PM CT scan shows ileus versus partial small bowel obstruction.  Given her likely bilious emesis and elevated lactic acid, I am concerned about sending her home.  I suspect she is dehydrated with the creatinine that is now 1.5 but do not have good comparison.  COVID and flu negative.  Lipase not elevated.  Mild leukocytosis but otherwise labs did not show concerning findings.  Patient family is agreeable to her getting in and out cath to look for urine infection contributing.  Chest x-ray was difficult to see due to her positioning but on the CT scan of the bases there did not appear to be convincing evidence of  pneumonia.    Had a discussion with family given the nausea, vomiting, appearance of dehydration, possible AKI, elevated lactic acid, and evidence of partial small bowel obstruction and we agree that I am concerned about her safety going home.  We will admit to medicine for rehydration, further management of the partial small bowel obstruction, and symptom management.  As I do not suspect she is a good surgical candidate given her age and comorbidities and it not showing a transition point or full small bowel obstruction, will hold on consulting general surgery at this time.  Will call medicine for admission.  Anticipate discussion with palliative care tomorrow to discuss further disposition   Final Clinical Impression(s) / ED Diagnoses Final diagnoses:  Partial small bowel obstruction (HCC)  Nausea and vomiting, unspecified vomiting type  Generalized abdominal pain     Clinical Impression: 1. Partial small bowel obstruction (HCC)   2. Nausea and vomiting, unspecified vomiting type   3. Generalized abdominal pain     Disposition: Admit  This note was prepared with assistance of Dragon voice recognition software. Occasional wrong-word or sound-a-like substitutions may have occurred due to the inherent limitations of voice recognition software.     Gay Moncivais, Canary Brim, MD 12/23/20 [phone]

## 2020-12-23 NOTE — H&P (Signed)
History and Physical    Denise Woodward AJO:[id number] DOB: December 28, 1919 DOA: 12/23/2020  Attention: Patient has duplicate chart marked for merge under MRN [id number].  PCP: Rodrigo Ran, MD  Patient coming from: Home  I have personally briefly reviewed patient's old medical records in [facility]  Chief Complaint: Abdominal pain, nausea, vomiting  HPI: Denise Woodward is a 87 y.o. female with medical history significant for advanced dementia, insulin-dependent type 2 diabetes, hypertension, hyperlipidemia, hypothyroidism, history of colon cancer s/p partial colectomy who presented to the ED for evaluation of abdominal pain with nausea and vomiting.  Patient unable to provide history due to dementia and is otherwise obtained from EDP, chart review, and family at bedside.  Daughter states that patient had developed recent URI symptoms with cough and chest congestion.  She was started on oral antibiotic with cefdinir.  On 12/22/2020 patient developed nausea with frequent episodes of dark liquid emesis.  She appeared to be having abdominal pain with increased fatigue from baseline.  Last bowel movement was evening of 12/22/2020.  Patient lives at home with family.  At baseline she is minimally communicative but recognizes family.  She is nonambulatory and family will transfer her upright into a chair for 4 hours a day.  She is essentially full assist for care.  ED Course:  Initial vitals showed BP 151/72, pulse 63, RR 20, temp 98.7 F, SPO2 92% on room air.  Labs show WBC 11.2, hemoglobin 13.5, platelets 326,000, sodium 140, potassium 4.1, bicarb 30, BUN 38, creatinine 1.50, serum glucose 195, LFTs within normal limits, lipase 26.  Initial lactic acid 4.1, repeat is 2.8 after receiving IV fluids.  Urinalysis negative for UTI.  SARS-CoV-2 and influenza PCR is negative.  Portable chest x-ray is a limited evaluation due to patient positioning.  No obvious focal consolidation,  pulmonary edema, or effusion seen.  CT abdomen/pelvis with contrast shows changes suggestive of early/partial small bowel obstruction versus ileus.  Moderate to large volume fat-containing infraumbilical lower abdominal ventral wall hernia seen without findings to suggest ischemia or cause of the bowel obstruction.  Scattered colonic diverticulosis without acute diverticulitis seen.  Prior sigmoid resection changes noted.  Cholelithiasis without findings of acute cholecystitis reported.  Patient was given 1 L normal saline, IV Zofran, and the hospitalist service was consulted to admit for further evaluation and management.  Review of Systems: All systems reviewed and are negative except as documented in history of present illness above.   Past Medical History:  Diagnosis Date   Advanced dementia    Hyperlipidemia associated with type 2 diabetes mellitus (HCC)    Hypertension associated with diabetes (HCC)    Hypothyroidism    Insulin dependent type 2 diabetes mellitus (HCC)     Past Surgical History:  Procedure Laterality Date   PARTIAL COLECTOMY      Social History:  has no history on file for tobacco use, alcohol use, and drug use.  Allergies  Allergen Reactions   Aspirin    Doxycycline     Lip swelling    Family History  Problem Relation Age of Onset   Hypertension Mother    Diabetes Father    Hypertension Father    Heart disease Father      Prior to Admission medications   Medication Sig Start Date End Date Taking? Authorizing Provider  acetaminophen (TYLENOL) 500 MG tablet Take 500 mg by mouth every 6 (six) hours as needed for moderate pain or headache.   Yes [provider]  atorvastatin (LIPITOR) 10 MG tablet Take 10 mg by mouth daily. 09/27/20  Yes [provider]  B-D ULTRAFINE III SHORT PEN 31G X 8 MM MISC SMARTSIG:Syringe(s) SUB-Q 10/23/20  Yes [provider]  benazepril (LOTENSIN) 5 MG tablet Take 5 mg by mouth daily. 09/28/20  Yes  [provider]  divalproex (DEPAKOTE SPRINKLE) 125 MG capsule Take 250 mg by mouth at bedtime. 10/08/20  Yes [provider]  donepezil (ARICEPT) 10 MG tablet Take 10 mg by mouth at bedtime. 09/24/20  Yes [provider]  HUMALOG KWIKPEN 100 UNIT/ML KwikPen Inject 5 Units into the skin 2 (two) times daily with a meal. 11/29/20  Yes [provider]  LEVEMIR FLEXTOUCH 100 UNIT/ML FlexTouch Pen Inject 16 Units into the skin in the morning. 11/13/20  Yes [provider]  metoprolol tartrate (LOPRESSOR) 25 MG tablet Take 25 mg by mouth 2 (two) times daily. 11/13/20  Yes [provider]  mirtazapine (REMERON) 15 MG tablet Take 15 mg by mouth at bedtime. 12/20/20  Yes [provider]  PARoxetine (PAXIL) 10 MG tablet Take 10 mg by mouth daily. 09/27/20  Yes [provider]  SYNTHROID 75 MCG tablet Take 75 mcg by mouth daily. 08/07/20  Yes [provider]  Vitamin D, Ergocalciferol, (DRISDOL) 1.25 MG (50000 UNIT) CAPS capsule Take 50,000 Units by mouth every Thursday.   Yes [provider]  cefdinir (OMNICEF) 300 MG capsule Take 300 mg by mouth 2 (two) times daily. Patient not taking: Reported on 12/23/2020 12/21/20   [provider]    Physical Exam: Vitals:   12/23/20 1915 12/23/20 1916 12/23/20 2149 12/23/20 2151  BP: (!) 158/143 (!) 153/89 134/64   Pulse: 64  73   Resp: 18  17   Temp:   98.5 F (36.9 C)   TempSrc:   Oral   SpO2: 100%  (!) 81% 100%   From patient due to dementia/somnolence. Constitutional: Elderly woman resting supine in bed, somnolent, appears comfortable. Eyes: PERRL, lids and conjunctivae normal ENMT: Mucous membranes are dry. Posterior pharynx clear of any exudate or lesions. Neck: normal, supple, no masses. Respiratory: clear to auscultation anteriorly. Cardiovascular: Regular rate and rhythm, no murmurs / rubs / gallops. No extremity edema. 2+ pedal pulses. Abdomen: Mild  tenderness elicited with light palpation, no masses palpated. Bowel sounds positive.  Musculoskeletal: no clubbing / cyanosis. No joint deformity upper and lower extremities.   Skin: no rashes, lesions, ulcers. No induration Neurologic: Limited exam due to somnolence. Psychiatric: Somnolent, nonverbal.  Labs on Admission: I have personally reviewed following labs and imaging studies  CBC: Recent Labs  Lab 12/23/20 1800  WBC 11.2*  NEUTROABS 8.2*  HGB 13.5  HCT 41.0  MCV 96.0  PLT 326   Basic Metabolic Panel: Recent Labs  Lab 12/23/20 1800  NA 140  K 4.1  CL 95*  CO2 30  GLUCOSE 195*  BUN 38*  CREATININE 1.50*  CALCIUM 9.7   GFR: CrCl cannot be calculated (Unknown ideal weight.). Liver Function Tests: Recent Labs  Lab 12/23/20 1800  AST 22  ALT 15  ALKPHOS 48  BILITOT 0.5  PROT 7.7  ALBUMIN 3.7   Recent Labs  Lab 12/23/20 1800  LIPASE 26   No results for input(s): AMMONIA in the last 168 hours. Coagulation Profile: No results for input(s): INR, PROTIME in the last 168 hours. Cardiac Enzymes: No results for input(s): CKTOTAL, CKMB, CKMBINDEX, TROPONINI in the last 168 hours. BNP (last  3 results) No results for input(s): PROBNP in the last 8760 hours. HbA1C: No results for input(s): HGBA1C in the last 72 hours. CBG: No results for input(s): GLUCAP in the last 168 hours. Lipid Profile: No results for input(s): CHOL, HDL, LDLCALC, TRIG, CHOLHDL, LDLDIRECT in the last 72 hours. Thyroid Function Tests: No results for input(s): TSH, T4TOTAL, FREET4, T3FREE, THYROIDAB in the last 72 hours. Anemia Panel: No results for input(s): VITAMINB12, FOLATE, FERRITIN, TIBC, IRON, RETICCTPCT in the last 72 hours. Urine analysis:    Component Value Date/Time   COLORURINE YELLOW 12/23/2020 2159   APPEARANCEUR CLEAR 12/23/2020 2159   LABSPEC 1.042 (H) 12/23/2020 2159   PHURINE 5.0 12/23/2020 2159   GLUCOSEU 50 (A) 12/23/2020 2159   HGBUR SMALL (A) 12/23/2020 2159    BILIRUBINUR NEGATIVE 12/23/2020 2159   KETONESUR 5 (A) 12/23/2020 2159   PROTEINUR 30 (A) 12/23/2020 2159   NITRITE NEGATIVE 12/23/2020 2159   LEUKOCYTESUR NEGATIVE 12/23/2020 2159    Radiological Exams on Admission: CT ABDOMEN PELVIS W CONTRAST  Result Date: 12/23/2020 CLINICAL DATA:  Bowel obstruction suspected. Colon cancer status post surgery EXAM: CT ABDOMEN AND PELVIS WITH CONTRAST TECHNIQUE: Multidetector CT imaging of the abdomen and pelvis was performed using the standard protocol following bolus administration of intravenous contrast. CONTRAST:  71mL OMNIPAQUE IOHEXOL 300 MG/ML  SOLN COMPARISON:  None. FINDINGS: Lower chest: Bilateral lower lobe atelectasis. Bronchial wall thickening. Hepatobiliary: No focal liver abnormality. Calcified gallstones noted layering within the gallbladder lumen. No gallbladder wall thickening or pericholecystic fluid. No biliary dilatation. Pancreas: No focal lesion. Normal pancreatic contour. No surrounding inflammatory changes. No main pancreatic ductal dilatation. Spleen: Normal in size without focal abnormality. Adrenals/Urinary Tract: No adrenal nodule bilaterally. Bilateral kidneys enhance symmetrically. No hydronephrosis. No hydroureter. The urinary bladder is grossly unremarkable with limited evaluation due to streak artifact. Stomach/Bowel: Surgical changes related to sigmoid resection. Stomach is within normal limits. Several loops of proximal mid small bowel mildly distended/dilated with fluid. No associated transition point. No small bowel thickening. No pneumatosis. No evidence of large bowel wall thickening or dilatation. Scattered colonic diverticulosis. The appendix is not definitely identified and may be surgically absent. Vascular/Lymphatic: No abdominal aorta or iliac aneurysm. Mild atherosclerotic plaque of the aorta and its branches. No abdominal, pelvic, or inguinal lymphadenopathy. Reproductive: Uterus and bilateral adnexa are unremarkable.  Nonspecific left adnexal region calcification. Other: No intraperitoneal free fluid. No intraperitoneal free gas. No organized fluid collection. Musculoskeletal: Moderate to large volume fat containing infraumbilical lower abdominal ventral wall hernia. Small fat containing right inguinal hernia. No suspicious lytic or blastic osseous lesions. No acute displaced fracture. Grade 1 anterolisthesis of L4 on L5. Bilateral total hip arthroplasties partially visualized. Other: Limited evaluation of the pelvis due to streak artifact originating from bilateral surgical femoral hardware. IMPRESSION: 1. Several loops of proximal mid small bowel mildly distended/dilated with fluid. No transition point identified. Finding could represent an ileus versus developing early/partial small bowel obstruction. 2. Moderate to large volume fat containing infraumbilical lower abdominal ventral wall hernia. No associated findings to suggest ischemia or cause of a bowel obstruction. 3. Scattered colonic diverticulosis with no acute diverticulitis in a patient status post sigmoid resection. 4. Cholelithiasis no findings of acute cholecystitis. Electronically Signed   By: Tish Frederickson M.D.   On: 12/23/2020 20:52   DG Chest Portable 1 View  Result Date: 12/23/2020 CLINICAL DATA:  Cough, on cefdinir to prevent pneumonia, now nausea, vomiting, abdominal pain. Concern for either aspiration, bowel obstruction or pneumonia  EXAM: PORTABLE CHEST 1 VIEW COMPARISON:  None. FINDINGS: Abnormal aortic arch contour likely due to positioning. Otherwise the heart and mediastinal contours are within normal limits. Limited evaluation of the left base due to patient positioning. No focal consolidation. No pulmonary edema. No pleural effusion. No pneumothorax. No acute osseous abnormality. IMPRESSION: 1. Abnormal aortic arch contour likely due to positioning. 2. Limited evaluation of the left base due to patient positioning. 3. Recommend repeat PA and  lateral view of the chest for further evaluation. Electronically Signed   By: Tish Frederickson M.D.   On: 12/23/2020 18:30    EKG: Not performed.  Assessment/Plan Principal Problem:   Partial small bowel obstruction (HCC) Active Problems:   Advanced dementia   Hyperlipidemia associated with type 2 diabetes mellitus (HCC)   Hypertension associated with diabetes (HCC)   Hypothyroidism   Insulin dependent type 2 diabetes mellitus (HCC)   Renal insufficiency   Denise Woodward is a 87 y.o. female with medical history significant for advanced dementia, insulin-dependent type 2 diabetes, hypertension, hyperlipidemia, hypothyroidism, history of colon cancer s/p partial colectomy who is admitted with partial small bowel obstruction.  Partial small bowel obstruction versus ileus: Seen on CT imaging.  Nausea and vomiting appears to be subsiding.  Last bowel movement reportedly evening of 10/13.  Patient has advanced dementia is not considered a surgical candidate. -Keep n.p.o. -Continue IV fluid hydration overnight -IV Zofran as needed -IV Dilaudid as needed for obvious pain  Advanced dementia: Patient minimally verbal, nonambulatory, and total care at baseline.  She is DNR/DNI.  He is following with palliative care as an outpatient.  If no significant improvement in bowel obstruction then would consider transition to comfort care/hospice.  Placed on delirium precautions.  Home Aricept and Depakote on hold while NPO.  Renal insufficiency: Creatinine 1.50 on admission.  No baseline for comparison.  Suspect prerenal related to GI losses in setting of bowel obstruction.  Continue IV fluid hydration and repeat labs in a.m.  Insulin-dependent type 2 diabetes: Placed on sensitive SSI q4h while NPO.  Hypertension: Lopressor and benazepril on hold while NPO.  Hypothyroidism: Synthroid on hold while NPO.  Hyperlipidemia: Atorvastatin on hold while NPO.  DVT prophylaxis: Subcutaneous  heparin Code Status: DNR, confirmed on admission Family Communication: Discussed with patient's daughter at bedside Disposition Plan: From home, dispo pending clinical progress Consults called: None Level of care: Med-Surg Admission status:  Status is: Observation  The patient remains OBS appropriate and will d/c before 2 midnights.  Darreld Mclean MD [facility]  If 7PM-7AM, please contact night-coverage [email or web address]  12/23/2020, 11:37 PM

## 2020-12-24 ENCOUNTER — Observation Stay (HOSPITAL_COMMUNITY): Payer: Medicare Other

## 2020-12-24 ENCOUNTER — Other Ambulatory Visit: Payer: Self-pay

## 2020-12-24 DIAGNOSIS — Z794 Long term (current) use of insulin: Secondary | ICD-10-CM

## 2020-12-24 DIAGNOSIS — Z96643 Presence of artificial hip joint, bilateral: Secondary | ICD-10-CM | POA: Diagnosis not present

## 2020-12-24 DIAGNOSIS — Z7401 Bed confinement status: Secondary | ICD-10-CM | POA: Diagnosis not present

## 2020-12-24 DIAGNOSIS — H9190 Unspecified hearing loss, unspecified ear: Secondary | ICD-10-CM | POA: Diagnosis present

## 2020-12-24 DIAGNOSIS — Z79890 Hormone replacement therapy (postmenopausal): Secondary | ICD-10-CM | POA: Diagnosis not present

## 2020-12-24 DIAGNOSIS — E119 Type 2 diabetes mellitus without complications: Secondary | ICD-10-CM | POA: Diagnosis not present

## 2020-12-24 DIAGNOSIS — E1159 Type 2 diabetes mellitus with other circulatory complications: Secondary | ICD-10-CM | POA: Diagnosis not present

## 2020-12-24 DIAGNOSIS — Z8249 Family history of ischemic heart disease and other diseases of the circulatory system: Secondary | ICD-10-CM | POA: Diagnosis not present

## 2020-12-24 DIAGNOSIS — N289 Disorder of kidney and ureter, unspecified: Secondary | ICD-10-CM

## 2020-12-24 DIAGNOSIS — E039 Hypothyroidism, unspecified: Secondary | ICD-10-CM | POA: Diagnosis present

## 2020-12-24 DIAGNOSIS — F03C0 Unspecified dementia, severe, without behavioral disturbance, psychotic disturbance, mood disturbance, and anxiety: Secondary | ICD-10-CM

## 2020-12-24 DIAGNOSIS — Z66 Do not resuscitate: Secondary | ICD-10-CM | POA: Diagnosis present

## 2020-12-24 DIAGNOSIS — K56609 Unspecified intestinal obstruction, unspecified as to partial versus complete obstruction: Secondary | ICD-10-CM | POA: Diagnosis present

## 2020-12-24 DIAGNOSIS — R1084 Generalized abdominal pain: Secondary | ICD-10-CM | POA: Diagnosis not present

## 2020-12-24 DIAGNOSIS — Z20822 Contact with and (suspected) exposure to covid-19: Secondary | ICD-10-CM | POA: Diagnosis present

## 2020-12-24 DIAGNOSIS — R532 Functional quadriplegia: Secondary | ICD-10-CM | POA: Diagnosis present

## 2020-12-24 DIAGNOSIS — E785 Hyperlipidemia, unspecified: Secondary | ICD-10-CM | POA: Diagnosis present

## 2020-12-24 DIAGNOSIS — Z9049 Acquired absence of other specified parts of digestive tract: Secondary | ICD-10-CM | POA: Diagnosis not present

## 2020-12-24 DIAGNOSIS — K8020 Calculus of gallbladder without cholecystitis without obstruction: Secondary | ICD-10-CM | POA: Diagnosis present

## 2020-12-24 DIAGNOSIS — R0902 Hypoxemia: Secondary | ICD-10-CM | POA: Diagnosis not present

## 2020-12-24 DIAGNOSIS — F0390 Unspecified dementia without behavioral disturbance: Secondary | ICD-10-CM | POA: Diagnosis present

## 2020-12-24 DIAGNOSIS — I152 Hypertension secondary to endocrine disorders: Secondary | ICD-10-CM | POA: Diagnosis present

## 2020-12-24 DIAGNOSIS — Z85038 Personal history of other malignant neoplasm of large intestine: Secondary | ICD-10-CM | POA: Diagnosis not present

## 2020-12-24 DIAGNOSIS — I959 Hypotension, unspecified: Secondary | ICD-10-CM | POA: Diagnosis not present

## 2020-12-24 DIAGNOSIS — Z79899 Other long term (current) drug therapy: Secondary | ICD-10-CM | POA: Diagnosis not present

## 2020-12-24 DIAGNOSIS — N179 Acute kidney failure, unspecified: Secondary | ICD-10-CM | POA: Diagnosis present

## 2020-12-24 DIAGNOSIS — K6389 Other specified diseases of intestine: Secondary | ICD-10-CM | POA: Diagnosis not present

## 2020-12-24 DIAGNOSIS — Z743 Need for continuous supervision: Secondary | ICD-10-CM | POA: Diagnosis not present

## 2020-12-24 DIAGNOSIS — Z833 Family history of diabetes mellitus: Secondary | ICD-10-CM | POA: Diagnosis not present

## 2020-12-24 DIAGNOSIS — K56600 Partial intestinal obstruction, unspecified as to cause: Secondary | ICD-10-CM | POA: Diagnosis present

## 2020-12-24 DIAGNOSIS — E1169 Type 2 diabetes mellitus with other specified complication: Secondary | ICD-10-CM | POA: Diagnosis present

## 2020-12-24 LAB — MAGNESIUM: Magnesium: 1.9 mg/dL (ref 1.7–2.4)

## 2020-12-24 LAB — CBC
HCT: 32.2 % — ABNORMAL LOW (ref 36.0–46.0)
Hemoglobin: 10.3 g/dL — ABNORMAL LOW (ref 12.0–15.0)
MCH: 31.6 pg (ref 26.0–34.0)
MCHC: 32 g/dL (ref 30.0–36.0)
MCV: 98.8 fL (ref 80.0–100.0)
Platelets: 270 10*3/uL (ref 150–400)
RBC: 3.26 MIL/uL — ABNORMAL LOW (ref 3.87–5.11)
RDW: 14 % (ref 11.5–15.5)
WBC: 10.5 10*3/uL (ref 4.0–10.5)
nRBC: 0 % (ref 0.0–0.2)

## 2020-12-24 LAB — GLUCOSE, CAPILLARY
Glucose-Capillary: 136 mg/dL — ABNORMAL HIGH (ref 70–99)
Glucose-Capillary: 125 mg/dL — ABNORMAL HIGH (ref 70–99)
Glucose-Capillary: 122 mg/dL — ABNORMAL HIGH (ref 70–99)

## 2020-12-24 LAB — BASIC METABOLIC PANEL
Anion gap: 9 (ref 5–15)
BUN: 37 mg/dL — ABNORMAL HIGH (ref 8–23)
CO2: 29 mmol/L (ref 22–32)
Calcium: 8.6 mg/dL — ABNORMAL LOW (ref 8.9–10.3)
Chloride: 101 mmol/L (ref 98–111)
Creatinine, Ser: 1.34 mg/dL — ABNORMAL HIGH (ref 0.44–1.00)
GFR, Estimated: 36 mL/min — ABNORMAL LOW (ref >60)
Glucose, Bld: 152 mg/dL — ABNORMAL HIGH (ref 70–99)
Potassium: 4.5 mmol/L (ref 3.5–5.1)
Sodium: 139 mmol/L (ref 135–145)

## 2020-12-24 LAB — CBG MONITORING, ED
Glucose-Capillary: 153 mg/dL — ABNORMAL HIGH (ref 70–99)
Glucose-Capillary: 142 mg/dL — ABNORMAL HIGH (ref 70–99)

## 2020-12-24 MED ORDER — ONDANSETRON HCL 4 MG PO TABS: 4.0000 mg | ORAL_TABLET | Freq: Four times a day (QID) | ORAL | Status: DC | PRN

## 2020-12-24 MED ORDER — LEVOTHYROXINE SODIUM 100 MCG/5ML IV SOLN
35.0000 ug | Freq: Every day | INTRAVENOUS | Status: DC
  Administered 2020-12-25 – 2020-12-26 (×2): 35 ug via INTRAVENOUS
  Filled 2020-12-24 (×3): qty 5

## 2020-12-24 MED ORDER — GUAIFENESIN 100 MG/5ML PO SOLN
5.0000 mL | ORAL | Status: DC | PRN
  Administered 2020-12-24 – 2020-12-26 (×5): 100 mg via ORAL
  Filled 2020-12-24 (×9): qty 5

## 2020-12-24 MED ORDER — ACETAMINOPHEN 325 MG PO TABS
650.0000 mg | ORAL_TABLET | Freq: Four times a day (QID) | ORAL | Status: DC | PRN
  Administered 2020-12-27: 650 mg via ORAL

## 2020-12-24 MED ORDER — ONDANSETRON HCL 4 MG/2ML IJ SOLN: 4.0000 mg | Freq: Four times a day (QID) | INTRAMUSCULAR | Status: DC | PRN

## 2020-12-24 MED ORDER — HYDROMORPHONE HCL 1 MG/ML IJ SOLN: 0.5000 mg | INTRAMUSCULAR | Status: DC | PRN

## 2020-12-24 MED ORDER — ACETAMINOPHEN 650 MG RE SUPP: 650.0000 mg | Freq: Four times a day (QID) | RECTAL | Status: DC | PRN

## 2020-12-24 MED ORDER — INSULIN ASPART 100 UNIT/ML IJ SOLN
0.0000 [IU] | INTRAMUSCULAR | Status: DC
  Administered 2020-12-24 – 2020-12-25 (×4): 1 [IU] via SUBCUTANEOUS

## 2020-12-24 MED ORDER — LACTATED RINGERS IV SOLN: INTRAVENOUS | Status: AC

## 2020-12-24 MED ORDER — HEPARIN SODIUM (PORCINE) 5000 UNIT/ML IJ SOLN
5000.0000 [IU] | Freq: Three times a day (TID) | INTRAMUSCULAR | Status: DC
  Administered 2020-12-24 – 2020-12-27 (×9): 5000 [IU] via SUBCUTANEOUS
  Filled 2020-12-24 (×9): qty 1

## 2020-12-24 MED ORDER — SODIUM CHLORIDE 0.9 % IV SOLN: INTRAVENOUS | Status: AC

## 2020-12-24 NOTE — Progress Notes (Signed)
PROGRESS NOTE        PATIENT DETAILS Name: Denise Woodward Age: 87 y.o. Sex: female Date of Birth: 20-Feb-1920 Admit Date: 12/23/2020 Admitting Physician Dewayne Shorter Levora Dredge, MD HEN:IDPOEU, Loraine Leriche, MD  Brief Narrative: Patient is a 87 y.o. female with history of advanced dementia, IDDM-2, HTN, HLD, hypothyroidism, colon cancer-s/p partial colectomy-who presented to the ED on 10/14 with nausea/vomiting and abdominal pain-found to have partial small bowel obstruction.  Subjective: Pleasantly confused-very hard of hearing.  Does not wince when I palpate her abdomen.  Per daughter at bedside-last vomiting episode was at 1 PM yesterday afternoon.  Objective: Vitals: Blood pressure (!) 127/59, pulse 71, temperature 98.3 F (36.8 C), temperature source Oral, resp. rate 18, SpO2 96 %.   Exam: Gen Exam: Not in any distress-confused HEENT:atraumatic, normocephalic Chest: B/L clear to auscultation anteriorly CVS:S1S2 regular Abdomen:soft non tender, non distended Extremities:no edema Neurology: Difficult exam-but appears nonfocal. Skin: no rash  Pertinent Labs/Radiology: WBC: 10.5 Hb: 10.3 Na: 139 K: 4.5 Creatinine: 1.34  10/14>> CT abdomen/pelvis: Early/partial small bowel obstruction.  Moderate lower abdominal ventral wall hernia with no findings to suggest ischemia or bowel obstruction.  Assessment/Plan: Partial small bowel obstruction: Vomiting has resolved-continue supportive care with IV fluids-start clears and how see she does.  If vomiting reoccurs-we will need NG tube (family will decide if they want to proceed).  If in the unlikely event she worsens and needs a laparotomy-family does not want to proceed with surgery-understand that in that scenario we will need to talk about end-of-life/hospice care.  AKI: Likely hemodynamically mediated-continue IVF and other supportive care.  Creatinine slowly improving.  Avoid nephrotoxic  agents.  Insulin-dependent DM-2: Stable-continue SSI.  Plan to resume usual insulin regimen when oral intake is stable.  Allow permissive hyperglycemia given frailty/advanced age  Recent Labs    12/24/20 0236 12/24/20 0840 12/24/20 1007  GLUCAP 153* 142* 136*    HTN: BP stable-currently off all antihypertensives-resume when able.  Hypothyroidism: Start IV levothyroxine-switch to oral route when able.  HLD: Resume statin when oral intake more stable.  Dementia: At risk for delirium-holding Depakote, Paxil, Remeron until oral intake more stable.  Debility/frailty: Severe/advanced dementia-bedbound-dependent on family for all daily activities of living  Goals of care: DNR in place-family wants and gentle medical treatment.  Unclear whether family will consent to NG tube.  They are clear that they would not want a laparotomy/surgical intervention in case her bowel obstruction worsens.  Procedures: None Consults: None DVT Prophylaxis: Heparin Code Status:DNR Family Communication: Daughter/son-in-law at bedside.  Time spent: 35 minutes-Greater than 50% of this time was spent in counseling, explanation of diagnosis, planning of further management, and coordination of care.  Diet: Diet Order             Diet clear liquid Room service appropriate? Yes; Fluid consistency: Thin  Diet effective now                      Disposition Plan: Status is: Inpatient  Remains inpatient appropriate because: Bowel obstruction-needs ongoing supportive care   Barriers to Discharge: Bowel obstruction-slowly advancing diet-starting clear liquids today.  Not yet stable for discharge.  Antimicrobial agents: Anti-infectives (From admission, onward)    None        MEDICATIONS: Scheduled Meds:  heparin  5,000 Units Subcutaneous Q8H   insulin aspart  0-9 Units Subcutaneous Q4H   Continuous Infusions: PRN Meds:.acetaminophen **OR** acetaminophen, HYDROmorphone (DILAUDID) injection,  ondansetron **OR** ondansetron (ZOFRAN) IV   I have personally reviewed following labs and imaging studies  LABORATORY DATA: CBC: Recent Labs  Lab 12/23/20 1800 12/24/20 0330  WBC 11.2* 10.5  NEUTROABS 8.2*  --   HGB 13.5 10.3*  HCT 41.0 32.2*  MCV 96.0 98.8  PLT 326 270    Basic Metabolic Panel: Recent Labs  Lab 12/23/20 1800 12/24/20 0248  NA 140 139  K 4.1 4.5  CL 95* 101  CO2 30 29  GLUCOSE 195* 152*  BUN 38* 37*  CREATININE 1.50* 1.34*  CALCIUM 9.7 8.6*  MG  --  1.9    GFR: CrCl cannot be calculated (Unknown ideal weight.).  Liver Function Tests: Recent Labs  Lab 12/23/20 1800  AST 22  ALT 15  ALKPHOS 48  BILITOT 0.5  PROT 7.7  ALBUMIN 3.7   Recent Labs  Lab 12/23/20 1800  LIPASE 26   No results for input(s): AMMONIA in the last 168 hours.  Coagulation Profile: No results for input(s): INR, PROTIME in the last 168 hours.  Cardiac Enzymes: No results for input(s): CKTOTAL, CKMB, CKMBINDEX, TROPONINI in the last 168 hours.  BNP (last 3 results) No results for input(s): PROBNP in the last 8760 hours.  Lipid Profile: No results for input(s): CHOL, HDL, LDLCALC, TRIG, CHOLHDL, LDLDIRECT in the last 72 hours.  Thyroid Function Tests: No results for input(s): TSH, T4TOTAL, FREET4, T3FREE, THYROIDAB in the last 72 hours.  Anemia Panel: No results for input(s): VITAMINB12, FOLATE, FERRITIN, TIBC, IRON, RETICCTPCT in the last 72 hours.  Urine analysis:    Component Value Date/Time   COLORURINE YELLOW 12/23/2020 2159   APPEARANCEUR CLEAR 12/23/2020 2159   LABSPEC 1.042 (H) 12/23/2020 2159   PHURINE 5.0 12/23/2020 2159   GLUCOSEU 50 (A) 12/23/2020 2159   HGBUR SMALL (A) 12/23/2020 2159   BILIRUBINUR NEGATIVE 12/23/2020 2159   KETONESUR 5 (A) 12/23/2020 2159   PROTEINUR 30 (A) 12/23/2020 2159   NITRITE NEGATIVE 12/23/2020 2159   LEUKOCYTESUR NEGATIVE 12/23/2020 2159    Sepsis Labs: Lactic Acid, Venous    Component Value Date/Time    LATICACIDVEN 2.8 (HH) 12/23/2020 1919    MICROBIOLOGY: Recent Results (from the past 240 hour(s))  Resp Panel by RT-PCR (Flu A&B, Covid) Nasopharyngeal Swab     Status: None   Collection Time: 12/23/20  6:05 PM   Specimen: Nasopharyngeal Swab; Nasopharyngeal(NP) swabs in vial transport medium  Result Value Ref Range Status   SARS Coronavirus 2 by RT PCR NEGATIVE NEGATIVE Final    Comment: (NOTE) SARS-CoV-2 target nucleic acids are NOT DETECTED.  The SARS-CoV-2 RNA is generally detectable in upper respiratory specimens during the acute phase of infection. The lowest concentration of SARS-CoV-2 viral copies this assay can detect is 138 copies/mL. A negative result does not preclude SARS-Cov-2 infection and should not be used as the sole basis for treatment or other patient management decisions. A negative result may occur with  improper specimen collection/handling, submission of specimen other than nasopharyngeal swab, presence of viral mutation(s) within the areas targeted by this assay, and inadequate number of viral copies(<138 copies/mL). A negative result must be combined with clinical observations, patient history, and epidemiological information. The expected result is Negative.  Fact Sheet for Patients:  BloggerCourse.com  Fact Sheet for Healthcare Providers:  SeriousBroker.it  This test is no t yet approved or cleared by the [place] and  has been authorized for detection and/or diagnosis of SARS-CoV-2 by FDA under an Emergency Use Authorization (EUA). This EUA will remain  in effect (meaning this test can be used) for the duration of the COVID-19 declaration under Section 564(b)(1) of the Act, 21 U.S.C.section 360bbb-3(b)(1), unless the authorization is terminated  or revoked sooner.       Influenza A by PCR NEGATIVE NEGATIVE Final   Influenza B by PCR NEGATIVE NEGATIVE Final    Comment: (NOTE) The Xpert  Xpress SARS-CoV-2/FLU/RSV plus assay is intended as an aid in the diagnosis of influenza from Nasopharyngeal swab specimens and should not be used as a sole basis for treatment. Nasal washings and aspirates are unacceptable for Xpert Xpress SARS-CoV-2/FLU/RSV testing.  Fact Sheet for Patients: BloggerCourse.com  Fact Sheet for Healthcare Providers: SeriousBroker.it  This test is not yet approved or cleared by the [place] FDA and has been authorized for detection and/or diagnosis of SARS-CoV-2 by FDA under an Emergency Use Authorization (EUA). This EUA will remain in effect (meaning this test can be used) for the duration of the COVID-19 declaration under Section 564(b)(1) of the Act, 21 U.S.C. section 360bbb-3(b)(1), unless the authorization is terminated or revoked.  Performed at [facility], [address] [place], [place] [address]     RADIOLOGY STUDIES/RESULTS: CT ABDOMEN PELVIS W CONTRAST  Result Date: 12/23/2020 CLINICAL DATA:  Bowel obstruction suspected. Colon cancer status post surgery EXAM: CT ABDOMEN AND PELVIS WITH CONTRAST TECHNIQUE: Multidetector CT imaging of the abdomen and pelvis was performed using the standard protocol following bolus administration of intravenous contrast. CONTRAST:  72mL OMNIPAQUE IOHEXOL 300 MG/ML  SOLN COMPARISON:  None. FINDINGS: Lower chest: Bilateral lower lobe atelectasis. Bronchial wall thickening. Hepatobiliary: No focal liver abnormality. Calcified gallstones noted layering within the gallbladder lumen. No gallbladder wall thickening or pericholecystic fluid. No biliary dilatation. Pancreas: No focal lesion. Normal pancreatic contour. No surrounding inflammatory changes. No main pancreatic ductal dilatation. Spleen: Normal in size without focal abnormality. Adrenals/Urinary Tract: No adrenal nodule bilaterally. Bilateral kidneys enhance symmetrically. No hydronephrosis. No  hydroureter. The urinary bladder is grossly unremarkable with limited evaluation due to streak artifact. Stomach/Bowel: Surgical changes related to sigmoid resection. Stomach is within normal limits. Several loops of proximal mid small bowel mildly distended/dilated with fluid. No associated transition point. No small bowel thickening. No pneumatosis. No evidence of large bowel wall thickening or dilatation. Scattered colonic diverticulosis. The appendix is not definitely identified and may be surgically absent. Vascular/Lymphatic: No abdominal aorta or iliac aneurysm. Mild atherosclerotic plaque of the aorta and its branches. No abdominal, pelvic, or inguinal lymphadenopathy. Reproductive: Uterus and bilateral adnexa are unremarkable. Nonspecific left adnexal region calcification. Other: No intraperitoneal free fluid. No intraperitoneal free gas. No organized fluid collection. Musculoskeletal: Moderate to large volume fat containing infraumbilical lower abdominal ventral wall hernia. Small fat containing right inguinal hernia. No suspicious lytic or blastic osseous lesions. No acute displaced fracture. Grade 1 anterolisthesis of L4 on L5. Bilateral total hip arthroplasties partially visualized. Other: Limited evaluation of the pelvis due to streak artifact originating from bilateral surgical femoral hardware. IMPRESSION: 1. Several loops of proximal mid small bowel mildly distended/dilated with fluid. No transition point identified. Finding could represent an ileus versus developing early/partial small bowel obstruction. 2. Moderate to large volume fat containing infraumbilical lower abdominal ventral wall hernia. No associated findings to suggest ischemia or cause of a bowel obstruction. 3. Scattered colonic diverticulosis with no acute diverticulitis in a patient status post sigmoid resection.  4. Cholelithiasis no findings of acute cholecystitis. Electronically Signed   By: Tish Frederickson M.D.   On: 12/23/2020  20:52   DG Chest Portable 1 View  Result Date: 12/23/2020 CLINICAL DATA:  Cough, on cefdinir to prevent pneumonia, now nausea, vomiting, abdominal pain. Concern for either aspiration, bowel obstruction or pneumonia EXAM: PORTABLE CHEST 1 VIEW COMPARISON:  None. FINDINGS: Abnormal aortic arch contour likely due to positioning. Otherwise the heart and mediastinal contours are within normal limits. Limited evaluation of the left base due to patient positioning. No focal consolidation. No pulmonary edema. No pleural effusion. No pneumothorax. No acute osseous abnormality. IMPRESSION: 1. Abnormal aortic arch contour likely due to positioning. 2. Limited evaluation of the left base due to patient positioning. 3. Recommend repeat PA and lateral view of the chest for further evaluation. Electronically Signed   By: Tish Frederickson M.D.   On: 12/23/2020 18:30   DG Abd Portable 1V  Result Date: 12/24/2020 CLINICAL DATA:  Partial small bowel obstruction. EXAM: PORTABLE ABDOMEN - 1 VIEW COMPARISON:  CT abdomen dated 12/23/2020. FINDINGS: Mildly distended gas-filled small bowel loops within the LEFT lower quadrant, measuring up to 3.8 cm diameter. No evidence of free intraperitoneal air is seen. Intravascular contrast material within the collecting system/bladder from earlier CT abdomen and pelvis. Bilateral hip prostheses in place. IMPRESSION: Mildly distended gas-filled small bowel loops within the LEFT lower quadrant, measuring up to 3.8 cm diameter, compatible with the given history of partial small bowel obstruction. Electronically Signed   By: Bary Richard M.D.   On: 12/24/2020 06:40     LOS: 0 days   Jeoffrey Massed, MD  [facility]    To contact the attending provider between 7A-7P or the covering provider during after hours 7P-7A, please log into the web site [email or web address] and access using universal [place] password for that web site. If you do not have the password, please call the  hospital operator.  12/24/2020, 12:51 PM

## 2020-12-24 NOTE — ED Notes (Signed)
MD remains at bedside at this time

## 2020-12-24 NOTE — ED Notes (Signed)
Dr Jerral Ralph at bedside for room air trial. Pt failed trial with O2 fluctuating and dropping into low 80's. Pt placed back on O2, [place] 3L

## 2020-12-25 DIAGNOSIS — E1169 Type 2 diabetes mellitus with other specified complication: Secondary | ICD-10-CM

## 2020-12-25 DIAGNOSIS — E039 Hypothyroidism, unspecified: Secondary | ICD-10-CM

## 2020-12-25 DIAGNOSIS — E1159 Type 2 diabetes mellitus with other circulatory complications: Secondary | ICD-10-CM

## 2020-12-25 DIAGNOSIS — I152 Hypertension secondary to endocrine disorders: Secondary | ICD-10-CM

## 2020-12-25 DIAGNOSIS — E785 Hyperlipidemia, unspecified: Secondary | ICD-10-CM

## 2020-12-25 LAB — GLUCOSE, CAPILLARY
Glucose-Capillary: 110 mg/dL — ABNORMAL HIGH (ref 70–99)
Glucose-Capillary: 112 mg/dL — ABNORMAL HIGH (ref 70–99)
Glucose-Capillary: 112 mg/dL — ABNORMAL HIGH (ref 70–99)
Glucose-Capillary: 125 mg/dL — ABNORMAL HIGH (ref 70–99)
Glucose-Capillary: 133 mg/dL — ABNORMAL HIGH (ref 70–99)
Glucose-Capillary: 99 mg/dL (ref 70–99)

## 2020-12-25 LAB — CBC
HCT: 30.9 % — ABNORMAL LOW (ref 36.0–46.0)
Hemoglobin: 9.8 g/dL — ABNORMAL LOW (ref 12.0–15.0)
MCH: 31.5 pg (ref 26.0–34.0)
MCHC: 31.7 g/dL (ref 30.0–36.0)
MCV: 99.4 fL (ref 80.0–100.0)
Platelets: 239 10*3/uL (ref 150–400)
RBC: 3.11 MIL/uL — ABNORMAL LOW (ref 3.87–5.11)
RDW: 14 % (ref 11.5–15.5)
WBC: 6.7 10*3/uL (ref 4.0–10.5)
nRBC: 0 % (ref 0.0–0.2)

## 2020-12-25 LAB — BASIC METABOLIC PANEL
Anion gap: 6 (ref 5–15)
BUN: 27 mg/dL — ABNORMAL HIGH (ref 8–23)
CO2: 31 mmol/L (ref 22–32)
Calcium: 8.2 mg/dL — ABNORMAL LOW (ref 8.9–10.3)
Chloride: 104 mmol/L (ref 98–111)
Creatinine, Ser: 1.08 mg/dL — ABNORMAL HIGH (ref 0.44–1.00)
GFR, Estimated: 46 mL/min — ABNORMAL LOW (ref >60)
Glucose, Bld: 115 mg/dL — ABNORMAL HIGH (ref 70–99)
Potassium: 3.7 mmol/L (ref 3.5–5.1)
Sodium: 141 mmol/L (ref 135–145)

## 2020-12-25 MED ORDER — SALINE SPRAY 0.65 % NA SOLN
1.0000 | NASAL | Status: DC | PRN
  Administered 2020-12-25: 1 via NASAL
  Filled 2020-12-25: qty 44

## 2020-12-25 MED ORDER — INSULIN ASPART 100 UNIT/ML IJ SOLN: 0.0000 [IU] | Freq: Three times a day (TID) | INTRAMUSCULAR | Status: DC

## 2020-12-25 MED ORDER — BISACODYL 10 MG RE SUPP
10.0000 mg | Freq: Every day | RECTAL | Status: DC | PRN
  Administered 2020-12-25: 10 mg via RECTAL
  Filled 2020-12-25 (×2): qty 1

## 2020-12-25 NOTE — Evaluation (Signed)
Clinical/Bedside Swallow Evaluation Patient Details  Name: Denise Woodward MRN: [id number] Date of Birth: [birth year]  Today's Date: 12/25/2020 Time: SLP Start Time (ACUTE ONLY): 1426 SLP Stop Time (ACUTE ONLY): 1435 SLP Time Calculation (min) (ACUTE ONLY): 9 min  Past Medical History:  Past Medical History:  Diagnosis Date   Advanced dementia    Hyperlipidemia associated with type 2 diabetes mellitus (HCC)    Hypertension associated with diabetes (HCC)    Hypothyroidism    Insulin dependent type 2 diabetes mellitus (HCC)    Past Surgical History:  Past Surgical History:  Procedure Laterality Date   PARTIAL COLECTOMY     HPI:  Patient is a 87 y.o. female who presented to the ED on 10/14 with nausea/vomiting and abdominal pain-found to have partial small bowel obstruction.  She recently had URI-like symptoms and was on antimicrobial therapy prior to this hospitalization. CXR 10/14:"No  focal consolidation. No pulmonary edema. No pleural effusion. No pneumothorax."  Pt with history of advanced dementia, IDDM-2, HTN, HLD, hypothyroidism, colon cancer-s/p partial colectomy.    Assessment / Plan / Recommendation  Clinical Impression  Pt presents with functional swallowing as assessed clinically.  Pt tolerated all consistencies trialed with no clinical s/s of aspiration. Daughter provided liquids via spoon. Pt exhibited good oral clearance of puree and solid textures.  There was a congested cough x1, which was seemingly unrelated to PO intake. Daughter is full time caregiver.  She reports no significant change to swallow function, but has concerns that congesting may be affecting her ability to swallow.  Discussed diet preference with family.  Pt eats softer foods when she is tired or unwell, and family is content to continue with this texture for now, but can eat peanut butter sandwiches when alert and focussed.    Recommend continuing pureed diet, but pt may advance to mechanical soft  solid texture as appropriate from GI standpoint.  Pt does not have further ST needs at this time.  SLP will sign off.  SLP Visit Diagnosis: Dysphagia, unspecified (R13.10)    Aspiration Risk  No limitations    Diet Recommendation Dysphagia 1 (Puree);Thin liquid   Supervision:  (1:1 feeding assistance) Compensations: Slow rate;Small sips/bites Postural Changes: Seated upright at 90 degrees    Other  Recommendations Oral Care Recommendations: Oral care BID    Recommendations for follow up therapy are one component of a multi-disciplinary discharge planning process, led by the attending physician.  Recommendations may be updated based on patient status, additional functional criteria and insurance authorization.  Follow up Recommendations None      Frequency and Duration  (N/A)          Prognosis Prognosis for Safe Diet Advancement:  (N/A)      Swallow Study   General Date of Onset: 12/23/20 HPI: Patient is a 87 y.o. female who presented to the ED on 10/14 with nausea/vomiting and abdominal pain-found to have partial small bowel obstruction.  She recently had URI-like symptoms and was on antimicrobial therapy prior to this hospitalization. CXR 10/14:"No  focal consolidation. No pulmonary edema. No pleural effusion. No pneumothorax."  Pt with history of advanced dementia, IDDM-2, HTN, HLD, hypothyroidism, colon cancer-s/p partial colectomy. Type of Study: Bedside Swallow Evaluation Previous Swallow Assessment: None Diet Prior to this Study: Dysphagia 1 (puree);Thin liquids Temperature Spikes Noted: No Respiratory Status: Nasal cannula History of Recent Intubation: No Behavior/Cognition: Alert;Cooperative Oral Cavity Assessment: Within Functional Limits Oral Care Completed by SLP: No Oral Cavity - Dentition: Dentures, bottom;Dentures,  top Self-Feeding Abilities: Total assist Patient Positioning: Upright in bed Baseline Vocal Quality:  (Congested) Volitional Cough: Cognitively  unable to elicit Volitional Swallow: Unable to elicit    Oral/Motor/Sensory Function Overall Oral Motor/Sensory Function: Within functional limits Facial ROM:  (Could not test) Facial Symmetry: Within Functional Limits Lingual ROM:  (Could not test) Lingual Symmetry:  (Could not test) Lingual Strength:  (Could not test) Velum:  (Could not test) Mandible: Within Functional Limits   Ice Chips Ice chips: Not tested   Thin Liquid Thin Liquid: Within functional limits Presentation: Spoon (daughter fed)    Nectar Thick Nectar Thick Liquid: Not tested   Honey Thick Honey Thick Liquid: Not tested   Puree Puree: Within functional limits Presentation: Spoon   Solid     Solid: Within functional limits Presentation:  (SLP fed)      Kerrie Pleasure , MA, CCC-SLP Acute Rehabilitation Services Office: [phone]; Pager [phone]): [phone] 12/25/2020,2:54 PM

## 2020-12-25 NOTE — Progress Notes (Addendum)
PROGRESS NOTE        PATIENT DETAILS Name: DESHANDA MOLITOR Age: 87 y.o. Sex: female Date of Birth: [birth year] Admit Date: 12/23/2020 Admitting Physician Dewayne Shorter Levora Dredge, MD MEQ:ASTMHD, Loraine Leriche, MD  Brief Narrative: Patient is a 87 y.o. female with history of advanced dementia, IDDM-2, HTN, HLD, hypothyroidism, colon cancer-s/p partial colectomy-who presented to the ED on 10/14 with nausea/vomiting and abdominal pain-found to have partial small bowel obstruction.  She recently had URI-like symptoms and was on antimicrobial therapy prior to this hospitalization.  Subjective: Sleeping comfortably-briefly opened eyes-but not very interactive.  Per daughter/son at bedside-able to tolerate some small amounts of liquids yesterday.  No vomiting since Friday 1 PM.  Objective: Vitals: Blood pressure (!) 146/48, pulse (!) 58, temperature 97.6 F (36.4 C), temperature source Oral, resp. rate 18, SpO2 99 %.   Exam: Gen Exam: Sleeping mostly but not in any distress. HEENT:atraumatic, normocephalic Chest: B/L clear to auscultation anteriorly CVS:S1S2 regular Abdomen: Bowel sound present-soft-nontender.  Extremities:no edema Neurology: Non focal Skin: no rash   Pertinent Labs/Radiology: WBC: 10.5 Hb: 10.3 Na: 139 K: 4.5 Creatinine: 1.34  10/14>> CT abdomen/pelvis: Early/partial small bowel obstruction.  Moderate lower abdominal ventral wall hernia with no findings to suggest ischemia or bowel obstruction.  Assessment/Plan: Partial small bowel obstruction: Vomiting has resolved-none since Friday 1 PM.  Seems to have tolerated some minimal amount of clear liquids yesterday.  Abdomen benign on exam.  Advance to a dysphagia 1 diet and see how she does.    AKI: Hemodynamically mediated-significantly improved.  Continue to avoid nephrotoxic agents.  Insulin-dependent DM-2: Stable-continue SSI.  Plan to resume usual insulin regimen when oral intake is stable.   Allow permissive hyperglycemia given frailty/advanced age  Recent Labs    12/25/20 0411 12/25/20 0804 12/25/20 1217  GLUCAP 125* 99 112*     HTN: BP stable-currently off all antihypertensives-resume when able.  Hypothyroidism: Start IV levothyroxine-switch to oral route when able.  HLD: Resume statin when oral intake more stable.  Dementia: At risk for delirium-holding Depakote, Paxil, Remeron until oral intake more stable.  Continues to cough intermittently-we will have SLP evaluate to ensure that she is in the right consistency of diet.  Debility/frailty: Severe/advanced dementia-bedbound-dependent on family for all daily activities of living  Goals of care: DNR in place-family wants and gentle medical treatment.  Unclear whether family will consent to NG tube.  They are clear that they would not want a laparotomy/surgical intervention in case her bowel obstruction worsens and aware that if we do get to that point-apart from initiation of hospice care-we really would not have any further options.  Procedures: None Consults: None DVT Prophylaxis: Heparin Code Status:DNR Family Communication: Daughter/son (vascular surgeon-practices a new [place]) at bedside.  Time spent: 25 minutes-Greater than 50% of this time was spent in counseling, explanation of diagnosis, planning of further management, and coordination of care.  Diet: Diet Order             DIET - DYS 1 Room service appropriate? Yes; Fluid consistency: Thin  Diet effective now                      Disposition Plan: Status is: Inpatient  Remains inpatient appropriate because: Bowel obstruction-needs ongoing supportive care   Barriers to Discharge: Bowel obstruction-slowly advancing diet-starting clear liquids today.  Not  yet stable for discharge.  Antimicrobial agents: Anti-infectives (From admission, onward)    None        MEDICATIONS: Scheduled Meds:  heparin  5,000 Units Subcutaneous Q8H    insulin aspart  0-9 Units Subcutaneous Q4H   levothyroxine  35 mcg Intravenous Daily   Continuous Infusions: PRN Meds:.acetaminophen **OR** acetaminophen, guaiFENesin, HYDROmorphone (DILAUDID) injection, ondansetron **OR** ondansetron (ZOFRAN) IV   I have personally reviewed following labs and imaging studies  LABORATORY DATA: CBC: Recent Labs  Lab 12/23/20 1800 12/24/20 0330 12/25/20 0052  WBC 11.2* 10.5 6.7  NEUTROABS 8.2*  --   --   HGB 13.5 10.3* 9.8*  HCT 41.0 32.2* 30.9*  MCV 96.0 98.8 99.4  PLT 326 270 239     Basic Metabolic Panel: Recent Labs  Lab 12/23/20 1800 12/24/20 0248 12/25/20 0052  NA 140 139 141  K 4.1 4.5 3.7  CL 95* 101 104  CO2 30 29 31   GLUCOSE 195* 152* 115*  BUN 38* 37* 27*  CREATININE 1.50* 1.34* 1.08*  CALCIUM 9.7 8.6* 8.2*  MG  --  1.9  --      GFR: CrCl cannot be calculated (Unknown ideal weight.).  Liver Function Tests: Recent Labs  Lab 12/23/20 1800  AST 22  ALT 15  ALKPHOS 48  BILITOT 0.5  PROT 7.7  ALBUMIN 3.7    Recent Labs  Lab 12/23/20 1800  LIPASE 26    No results for input(s): AMMONIA in the last 168 hours.  Coagulation Profile: No results for input(s): INR, PROTIME in the last 168 hours.  Cardiac Enzymes: No results for input(s): CKTOTAL, CKMB, CKMBINDEX, TROPONINI in the last 168 hours.  BNP (last 3 results) No results for input(s): PROBNP in the last 8760 hours.  Lipid Profile: No results for input(s): CHOL, HDL, LDLCALC, TRIG, CHOLHDL, LDLDIRECT in the last 72 hours.  Thyroid Function Tests: No results for input(s): TSH, T4TOTAL, FREET4, T3FREE, THYROIDAB in the last 72 hours.  Anemia Panel: No results for input(s): VITAMINB12, FOLATE, FERRITIN, TIBC, IRON, RETICCTPCT in the last 72 hours.  Urine analysis:    Component Value Date/Time   COLORURINE YELLOW 12/23/2020 2159   APPEARANCEUR CLEAR 12/23/2020 2159   LABSPEC 1.042 (H) 12/23/2020 2159   PHURINE 5.0 12/23/2020 2159   GLUCOSEU 50  (A) 12/23/2020 2159   HGBUR SMALL (A) 12/23/2020 2159   BILIRUBINUR NEGATIVE 12/23/2020 2159   KETONESUR 5 (A) 12/23/2020 2159   PROTEINUR 30 (A) 12/23/2020 2159   NITRITE NEGATIVE 12/23/2020 2159   LEUKOCYTESUR NEGATIVE 12/23/2020 2159    Sepsis Labs: Lactic Acid, Venous    Component Value Date/Time   LATICACIDVEN 2.8 (HH) 12/23/2020 1919    MICROBIOLOGY: Recent Results (from the past 240 hour(s))  Resp Panel by RT-PCR (Flu A&B, Covid) Nasopharyngeal Swab     Status: None   Collection Time: 12/23/20  6:05 PM   Specimen: Nasopharyngeal Swab; Nasopharyngeal(NP) swabs in vial transport medium  Result Value Ref Range Status   SARS Coronavirus 2 by RT PCR NEGATIVE NEGATIVE Final    Comment: (NOTE) SARS-CoV-2 target nucleic acids are NOT DETECTED.  The SARS-CoV-2 RNA is generally detectable in upper respiratory specimens during the acute phase of infection. The lowest concentration of SARS-CoV-2 viral copies this assay can detect is 138 copies/mL. A negative result does not preclude SARS-Cov-2 infection and should not be used as the sole basis for treatment or other patient management decisions. A negative result may occur with  improper specimen collection/handling, submission of  specimen other than nasopharyngeal swab, presence of viral mutation(s) within the areas targeted by this assay, and inadequate number of viral copies(<138 copies/mL). A negative result must be combined with clinical observations, patient history, and epidemiological information. The expected result is Negative.  Fact Sheet for Patients:  BloggerCourse.com  Fact Sheet for Healthcare Providers:  SeriousBroker.it  This test is no t yet approved or cleared by the [place] FDA and  has been authorized for detection and/or diagnosis of SARS-CoV-2 by FDA under an Emergency Use Authorization (EUA). This EUA will remain  in effect (meaning this test can  be used) for the duration of the COVID-19 declaration under Section 564(b)(1) of the Act, 21 U.S.C.section 360bbb-3(b)(1), unless the authorization is terminated  or revoked sooner.       Influenza A by PCR NEGATIVE NEGATIVE Final   Influenza B by PCR NEGATIVE NEGATIVE Final    Comment: (NOTE) The Xpert Xpress SARS-CoV-2/FLU/RSV plus assay is intended as an aid in the diagnosis of influenza from Nasopharyngeal swab specimens and should not be used as a sole basis for treatment. Nasal washings and aspirates are unacceptable for Xpert Xpress SARS-CoV-2/FLU/RSV testing.  Fact Sheet for Patients: BloggerCourse.com  Fact Sheet for Healthcare Providers: SeriousBroker.it  This test is not yet approved or cleared by the [place] FDA and has been authorized for detection and/or diagnosis of SARS-CoV-2 by FDA under an Emergency Use Authorization (EUA). This EUA will remain in effect (meaning this test can be used) for the duration of the COVID-19 declaration under Section 564(b)(1) of the Act, 21 U.S.C. section 360bbb-3(b)(1), unless the authorization is terminated or revoked.  Performed at [facility], [address] [place], [place] [address]     RADIOLOGY STUDIES/RESULTS: CT ABDOMEN PELVIS W CONTRAST  Result Date: 12/23/2020 CLINICAL DATA:  Bowel obstruction suspected. Colon cancer status post surgery EXAM: CT ABDOMEN AND PELVIS WITH CONTRAST TECHNIQUE: Multidetector CT imaging of the abdomen and pelvis was performed using the standard protocol following bolus administration of intravenous contrast. CONTRAST:  9mL OMNIPAQUE IOHEXOL 300 MG/ML  SOLN COMPARISON:  None. FINDINGS: Lower chest: Bilateral lower lobe atelectasis. Bronchial wall thickening. Hepatobiliary: No focal liver abnormality. Calcified gallstones noted layering within the gallbladder lumen. No gallbladder wall thickening or pericholecystic fluid. No biliary  dilatation. Pancreas: No focal lesion. Normal pancreatic contour. No surrounding inflammatory changes. No main pancreatic ductal dilatation. Spleen: Normal in size without focal abnormality. Adrenals/Urinary Tract: No adrenal nodule bilaterally. Bilateral kidneys enhance symmetrically. No hydronephrosis. No hydroureter. The urinary bladder is grossly unremarkable with limited evaluation due to streak artifact. Stomach/Bowel: Surgical changes related to sigmoid resection. Stomach is within normal limits. Several loops of proximal mid small bowel mildly distended/dilated with fluid. No associated transition point. No small bowel thickening. No pneumatosis. No evidence of large bowel wall thickening or dilatation. Scattered colonic diverticulosis. The appendix is not definitely identified and may be surgically absent. Vascular/Lymphatic: No abdominal aorta or iliac aneurysm. Mild atherosclerotic plaque of the aorta and its branches. No abdominal, pelvic, or inguinal lymphadenopathy. Reproductive: Uterus and bilateral adnexa are unremarkable. Nonspecific left adnexal region calcification. Other: No intraperitoneal free fluid. No intraperitoneal free gas. No organized fluid collection. Musculoskeletal: Moderate to large volume fat containing infraumbilical lower abdominal ventral wall hernia. Small fat containing right inguinal hernia. No suspicious lytic or blastic osseous lesions. No acute displaced fracture. Grade 1 anterolisthesis of L4 on L5. Bilateral total hip arthroplasties partially visualized. Other: Limited evaluation of the pelvis due to streak artifact  originating from bilateral surgical femoral hardware. IMPRESSION: 1. Several loops of proximal mid small bowel mildly distended/dilated with fluid. No transition point identified. Finding could represent an ileus versus developing early/partial small bowel obstruction. 2. Moderate to large volume fat containing infraumbilical lower abdominal ventral wall  hernia. No associated findings to suggest ischemia or cause of a bowel obstruction. 3. Scattered colonic diverticulosis with no acute diverticulitis in a patient status post sigmoid resection. 4. Cholelithiasis no findings of acute cholecystitis. Electronically Signed   By: Tish Frederickson M.D.   On: 12/23/2020 20:52   DG Chest Portable 1 View  Result Date: 12/23/2020 CLINICAL DATA:  Cough, on cefdinir to prevent pneumonia, now nausea, vomiting, abdominal pain. Concern for either aspiration, bowel obstruction or pneumonia EXAM: PORTABLE CHEST 1 VIEW COMPARISON:  None. FINDINGS: Abnormal aortic arch contour likely due to positioning. Otherwise the heart and mediastinal contours are within normal limits. Limited evaluation of the left base due to patient positioning. No focal consolidation. No pulmonary edema. No pleural effusion. No pneumothorax. No acute osseous abnormality. IMPRESSION: 1. Abnormal aortic arch contour likely due to positioning. 2. Limited evaluation of the left base due to patient positioning. 3. Recommend repeat PA and lateral view of the chest for further evaluation. Electronically Signed   By: Tish Frederickson M.D.   On: 12/23/2020 18:30   DG Abd Portable 1V  Result Date: 12/24/2020 CLINICAL DATA:  Partial small bowel obstruction. EXAM: PORTABLE ABDOMEN - 1 VIEW COMPARISON:  CT abdomen dated 12/23/2020. FINDINGS: Mildly distended gas-filled small bowel loops within the LEFT lower quadrant, measuring up to 3.8 cm diameter. No evidence of free intraperitoneal air is seen. Intravascular contrast material within the collecting system/bladder from earlier CT abdomen and pelvis. Bilateral hip prostheses in place. IMPRESSION: Mildly distended gas-filled small bowel loops within the LEFT lower quadrant, measuring up to 3.8 cm diameter, compatible with the given history of partial small bowel obstruction. Electronically Signed   By: Bary Richard M.D.   On: 12/24/2020 06:40     LOS: 1 day    Jeoffrey Massed, MD  [facility]    To contact the attending provider between 7A-7P or the covering provider during after hours 7P-7A, please log into the web site [email or web address] and access using universal [place] password for that web site. If you do not have the password, please call the hospital operator.  12/25/2020, 2:19 PM

## 2020-12-26 ENCOUNTER — Encounter (HOSPITAL_COMMUNITY): Payer: Self-pay | Admitting: Internal Medicine

## 2020-12-26 LAB — BASIC METABOLIC PANEL
Anion gap: 7 (ref 5–15)
BUN: 24 mg/dL — ABNORMAL HIGH (ref 8–23)
CO2: 29 mmol/L (ref 22–32)
Calcium: 8.3 mg/dL — ABNORMAL LOW (ref 8.9–10.3)
Chloride: 104 mmol/L (ref 98–111)
Creatinine, Ser: 1.08 mg/dL — ABNORMAL HIGH (ref 0.44–1.00)
GFR, Estimated: 46 mL/min — ABNORMAL LOW (ref >60)
Glucose, Bld: 111 mg/dL — ABNORMAL HIGH (ref 70–99)
Potassium: 3.6 mmol/L (ref 3.5–5.1)
Sodium: 140 mmol/L (ref 135–145)

## 2020-12-26 LAB — GLUCOSE, CAPILLARY
Glucose-Capillary: 105 mg/dL — ABNORMAL HIGH (ref 70–99)
Glucose-Capillary: 102 mg/dL — ABNORMAL HIGH (ref 70–99)
Glucose-Capillary: 99 mg/dL (ref 70–99)
Glucose-Capillary: 109 mg/dL — ABNORMAL HIGH (ref 70–99)

## 2020-12-26 MED ORDER — LACTATED RINGERS IV SOLN: INTRAVENOUS | Status: DC

## 2020-12-26 MED ORDER — SENNOSIDES-DOCUSATE SODIUM 8.6-50 MG PO TABS
3.0000 | ORAL_TABLET | Freq: Every day | ORAL | Status: DC
  Administered 2020-12-26: 3 via ORAL
  Filled 2020-12-26: qty 3

## 2020-12-26 MED ORDER — FLEET ENEMA 7-19 GM/118ML RE ENEM
1.0000 | ENEMA | Freq: Once | RECTAL | Status: AC
  Administered 2020-12-26: 1 via RECTAL
  Filled 2020-12-26: qty 1

## 2020-12-26 MED ORDER — POLYETHYLENE GLYCOL 3350 17 G PO PACK
17.0000 g | PACK | Freq: Three times a day (TID) | ORAL | Status: DC
  Administered 2020-12-26 – 2020-12-27 (×2): 17 g via ORAL
  Filled 2020-12-26 (×2): qty 1

## 2020-12-26 MED ORDER — LEVEMIR FLEXTOUCH 100 UNIT/ML ~~LOC~~ SOPN: 8.0000 [IU] | PEN_INJECTOR | Freq: Every morning | SUBCUTANEOUS | 11 refills | Status: DC

## 2020-12-26 MED ORDER — POLYETHYLENE GLYCOL 3350 17 G PO PACK
17.0000 g | PACK | Freq: Two times a day (BID) | ORAL | Status: DC
  Administered 2020-12-26: 17 g via ORAL
  Filled 2020-12-26: qty 1

## 2020-12-26 MED ORDER — LEVOTHYROXINE SODIUM 75 MCG PO TABS
75.0000 ug | ORAL_TABLET | Freq: Every day | ORAL | Status: DC
  Administered 2020-12-27: 75 ug via ORAL
  Filled 2020-12-26: qty 1

## 2020-12-26 MED ORDER — SORBITOL 70 % SOLN
960.0000 mL | TOPICAL_OIL | Freq: Once | ORAL | Status: DC
  Filled 2020-12-26: qty 473

## 2020-12-26 MED ORDER — FLEET ENEMA 7-19 GM/118ML RE ENEM: 1.0000 | ENEMA | Freq: Once | RECTAL | Status: DC

## 2020-12-26 NOTE — Progress Notes (Signed)
[facility] [id number] [facility]) Hospital Liaison note:  This patient is currently enrolled in [facility] outpatient-based Palliative Care. Will continue to follow for disposition.  Please call with any outpatient palliative questions or concerns.  Thank you, Abran Cantor, LPN [facility] Liaison [phone]

## 2020-12-26 NOTE — Progress Notes (Signed)
PTAR cancelled, discharged was cancelled by MD.

## 2020-12-26 NOTE — Social Work (Signed)
CSW confirmed address with pts daughter [name]. CSW will call PTAR when pt is ready for DC.   Pt has no other TOC needs.  Jimmy Picket, LCSW Clinical Social Worker

## 2020-12-26 NOTE — Discharge Summary (Signed)
PATIENT DETAILS Name: Denise Woodward Age: 87 y.o. Sex: female Date of Birth: [birth year] MRN: [id number]. Admitting Physician: Maretta Bees, MD [facility]:[id number], Loraine Leriche, MD  Admit Date: 12/23/2020 Discharge date: 12/26/2020  Recommendations for Outpatient Follow-up:  Follow up with PCP in 1-2 weeks  Admitted From:  Home  Disposition: Home   Home Health: No  Equipment/Devices: None  Discharge Condition: Stable  CODE STATUS:  DNR  Diet recommendation:  Diet Order             Diet - low sodium heart healthy           DIET - DYS 1 Room service appropriate? Yes; Fluid consistency: Thin  Diet effective now                    Brief Summary: Patient is a 87 y.o. female with history of advanced dementia, IDDM-2, HTN, HLD, hypothyroidism, colon cancer-s/p partial colectomy-who presented to the ED on 10/14 with nausea/vomiting and abdominal pain-found to have partial small bowel obstruction.  She recently had URI-like symptoms and was on antimicrobial therapy prior to this hospitalization.    Brief Hospital Course: Partial small bowel obstruction: Resolved with supportive care-no vomiting since Friday 1 PM-passing small BMs this morning.  Tolerating advancement in diet.  Stable for discharge.   AKI: Hemodynamically mediated-significantly improved.  Continue periodic monitoring in the outpatient setting.   Insulin-dependent DM-2: CBGs were stable-resume usual insulin regimen on discharge.  HTN: BP stable-resume usual antihypertensive on discharge.   Hypothyroidism: On levothyroxine intravenously-switch to oral route on discharge.   HLD: Resume statin    Dementia: At baseline-resume Depakote/Paxil/Remeron.    Debility/frailty: Severe/advanced dementia-bedbound-dependent on family for all daily activities of living   Goals of care: DNR in place-family wants only gentle medical treatment.  Unclear whether family will consent to NG tube.  They are clear that  they would not want a laparotomy/surgical intervention in case her bowel obstruction worsens and aware that if we do get to that point-apart from initiation of hospice care-we really would not have any further options.   Procedures None  Discharge Diagnoses:  Principal Problem:   Partial small bowel obstruction (HCC) Active Problems:   Advanced dementia   Hyperlipidemia associated with type 2 diabetes mellitus (HCC)   Hypertension associated with diabetes (HCC)   Hypothyroidism   Insulin dependent type 2 diabetes mellitus (HCC)   Renal insufficiency   SBO (small bowel obstruction) (HCC)   Discharge Instructions:  Activity:  As tolerated  Discharge Instructions     Diet - low sodium heart healthy   Complete by: As directed    Discharge instructions   Complete by: As directed    Follow with Primary MD  Rodrigo Ran, MD in 1-2 weeks  Please get a complete blood count and chemistry panel checked by your Primary MD at your next visit, and again as instructed by your Primary MD.  Get Medicines reviewed and adjusted: Please take all your medications with you for your next visit with your Primary MD  Laboratory/radiological data: Please request your Primary MD to go over all hospital tests and procedure/radiological results at the follow up, please ask your Primary MD to get all Hospital records sent to his/her office.  In some cases, they will be blood work, cultures and biopsy results pending at the time of your discharge. Please request that your primary care M.D. follows up on these results.  Also Note the following: If you experience worsening of  your admission symptoms, develop shortness of breath, life threatening emergency, suicidal or homicidal thoughts you must seek medical attention immediately by calling 911 or calling your MD immediately  if symptoms less severe.  You must read complete instructions/literature along with all the possible adverse reactions/side effects  for all the Medicines you take and that have been prescribed to you. Take any new Medicines after you have completely understood and accpet all the possible adverse reactions/side effects.   Do not drive when taking Pain medications or sleeping medications (Benzodaizepines)  Do not take more than prescribed Pain, Sleep and Anxiety Medications. It is not advisable to combine anxiety,sleep and pain medications without talking with your primary care practitioner  Special Instructions: If you have smoked or chewed Tobacco  in the last 2 yrs please stop smoking, stop any regular Alcohol  and or any Recreational drug use.  Wear Seat belts while driving.  Please note: You were cared for by a hospitalist during your hospital stay. Once you are discharged, your primary care physician will handle any further medical issues. Please note that NO REFILLS for any discharge medications will be authorized once you are discharged, as it is imperative that you return to your primary care physician (or establish a relationship with a primary care physician if you do not have one) for your post hospital discharge needs so that they can reassess your need for medications and monitor your lab values.   Increase activity slowly   Complete by: As directed       Allergies as of 12/26/2020       Reactions   Aspirin    Doxycycline    Lip swelling        Medication List     STOP taking these medications    cefdinir 300 MG capsule Commonly known as: OMNICEF       TAKE these medications    acetaminophen 500 MG tablet Commonly known as: TYLENOL Take 500 mg by mouth every 6 (six) hours as needed for moderate pain or headache.   atorvastatin 10 MG tablet Commonly known as: LIPITOR Take 10 mg by mouth daily.   B-D ULTRAFINE III SHORT PEN 31G X 8 MM Misc Generic drug: Insulin Pen Needle SMARTSIG:Syringe(s) SUB-Q   benazepril 5 MG tablet Commonly known as: LOTENSIN Take 5 mg by mouth daily.    divalproex 125 MG capsule Commonly known as: DEPAKOTE SPRINKLE Take 250 mg by mouth at bedtime.   donepezil 10 MG tablet Commonly known as: ARICEPT Take 10 mg by mouth at bedtime.   HumaLOG KwikPen 100 UNIT/ML KwikPen Generic drug: insulin lispro Inject 5 Units into the skin 2 (two) times daily with a meal.   Levemir FlexTouch 100 UNIT/ML FlexPen Generic drug: insulin detemir Inject 8 Units into the skin in the morning. Start with 8 units of Levemir daily-and slowly increase by 3-4 units if her diet remains stable to her usual regimen of 16 units daily. What changed:  how much to take additional instructions   metoprolol tartrate 25 MG tablet Commonly known as: LOPRESSOR Take 25 mg by mouth 2 (two) times daily.   mirtazapine 15 MG tablet Commonly known as: REMERON Take 15 mg by mouth at bedtime.   PARoxetine 10 MG tablet Commonly known as: PAXIL Take 10 mg by mouth daily.   Synthroid 75 MCG tablet Generic drug: levothyroxine Take 75 mcg by mouth daily.   Vitamin D (Ergocalciferol) 1.25 MG (50000 UNIT) Caps capsule Commonly known as: DRISDOL Take  50,000 Units by mouth every Thursday.        Follow-up Information     Rodrigo Ran, MD. Schedule an appointment as soon as possible for a visit in 1 week(s).   Specialty: Internal Medicine Contact information: [address] [place] [address] [phone]                Allergies  Allergen Reactions   Aspirin    Doxycycline     Lip swelling    Consultations:  None   Other Procedures/Studies: CT ABDOMEN PELVIS W CONTRAST  Result Date: 12/23/2020 CLINICAL DATA:  Bowel obstruction suspected. Colon cancer status post surgery EXAM: CT ABDOMEN AND PELVIS WITH CONTRAST TECHNIQUE: Multidetector CT imaging of the abdomen and pelvis was performed using the standard protocol following bolus administration of intravenous contrast. CONTRAST:  71mL OMNIPAQUE IOHEXOL 300 MG/ML  SOLN COMPARISON:  None.  FINDINGS: Lower chest: Bilateral lower lobe atelectasis. Bronchial wall thickening. Hepatobiliary: No focal liver abnormality. Calcified gallstones noted layering within the gallbladder lumen. No gallbladder wall thickening or pericholecystic fluid. No biliary dilatation. Pancreas: No focal lesion. Normal pancreatic contour. No surrounding inflammatory changes. No main pancreatic ductal dilatation. Spleen: Normal in size without focal abnormality. Adrenals/Urinary Tract: No adrenal nodule bilaterally. Bilateral kidneys enhance symmetrically. No hydronephrosis. No hydroureter. The urinary bladder is grossly unremarkable with limited evaluation due to streak artifact. Stomach/Bowel: Surgical changes related to sigmoid resection. Stomach is within normal limits. Several loops of proximal mid small bowel mildly distended/dilated with fluid. No associated transition point. No small bowel thickening. No pneumatosis. No evidence of large bowel wall thickening or dilatation. Scattered colonic diverticulosis. The appendix is not definitely identified and may be surgically absent. Vascular/Lymphatic: No abdominal aorta or iliac aneurysm. Mild atherosclerotic plaque of the aorta and its branches. No abdominal, pelvic, or inguinal lymphadenopathy. Reproductive: Uterus and bilateral adnexa are unremarkable. Nonspecific left adnexal region calcification. Other: No intraperitoneal free fluid. No intraperitoneal free gas. No organized fluid collection. Musculoskeletal: Moderate to large volume fat containing infraumbilical lower abdominal ventral wall hernia. Small fat containing right inguinal hernia. No suspicious lytic or blastic osseous lesions. No acute displaced fracture. Grade 1 anterolisthesis of L4 on L5. Bilateral total hip arthroplasties partially visualized. Other: Limited evaluation of the pelvis due to streak artifact originating from bilateral surgical femoral hardware. IMPRESSION: 1. Several loops of proximal mid  small bowel mildly distended/dilated with fluid. No transition point identified. Finding could represent an ileus versus developing early/partial small bowel obstruction. 2. Moderate to large volume fat containing infraumbilical lower abdominal ventral wall hernia. No associated findings to suggest ischemia or cause of a bowel obstruction. 3. Scattered colonic diverticulosis with no acute diverticulitis in a patient status post sigmoid resection. 4. Cholelithiasis no findings of acute cholecystitis. Electronically Signed   By: Tish Frederickson M.D.   On: 12/23/2020 20:52   DG Chest Portable 1 View  Result Date: 12/23/2020 CLINICAL DATA:  Cough, on cefdinir to prevent pneumonia, now nausea, vomiting, abdominal pain. Concern for either aspiration, bowel obstruction or pneumonia EXAM: PORTABLE CHEST 1 VIEW COMPARISON:  None. FINDINGS: Abnormal aortic arch contour likely due to positioning. Otherwise the heart and mediastinal contours are within normal limits. Limited evaluation of the left base due to patient positioning. No focal consolidation. No pulmonary edema. No pleural effusion. No pneumothorax. No acute osseous abnormality. IMPRESSION: 1. Abnormal aortic arch contour likely due to positioning. 2. Limited evaluation of the left base due to patient positioning. 3. Recommend repeat PA and lateral view  of the chest for further evaluation. Electronically Signed   By: Tish Frederickson M.D.   On: 12/23/2020 18:30   DG Abd Portable 1V  Result Date: 12/24/2020 CLINICAL DATA:  Partial small bowel obstruction. EXAM: PORTABLE ABDOMEN - 1 VIEW COMPARISON:  CT abdomen dated 12/23/2020. FINDINGS: Mildly distended gas-filled small bowel loops within the LEFT lower quadrant, measuring up to 3.8 cm diameter. No evidence of free intraperitoneal air is seen. Intravascular contrast material within the collecting system/bladder from earlier CT abdomen and pelvis. Bilateral hip prostheses in place. IMPRESSION: Mildly  distended gas-filled small bowel loops within the LEFT lower quadrant, measuring up to 3.8 cm diameter, compatible with the given history of partial small bowel obstruction. Electronically Signed   By: Bary Richard M.D.   On: 12/24/2020 06:40     TODAY-DAY OF DISCHARGE:  Subjective:   Newman Nip today has no headache,no chest abdominal pain,no new weakness tingling or numbness, feels much better wants to go home today.   Objective:   Blood pressure (!) 139/49, pulse 63, temperature 98.6 F (37 C), temperature source Oral, resp. rate 19, SpO2 95 %.  Intake/Output Summary (Last 24 hours) at 12/26/2020 0933 Last data filed at 12/26/2020 0609 Gross per 24 hour  Intake 100 ml  Output --  Net 100 ml   There were no vitals filed for this visit.  Exam: Awake Alert, Oriented *3, No new F.N deficits, Normal affect [place].AT,PERRAL Supple Neck,No JVD, No cervical lymphadenopathy appriciated.  Symmetrical Chest wall movement, Good air movement bilaterally, CTAB RRR,No Gallops,Rubs or new Murmurs, No Parasternal Heave +ve B.Sounds, Abd Soft, Non tender, No organomegaly appriciated, No rebound -guarding or rigidity. No Cyanosis, Clubbing or edema, No new Rash or bruise   PERTINENT RADIOLOGIC STUDIES: No results found.   PERTINENT LAB RESULTS: CBC: Recent Labs    12/24/20 0330 12/25/20 0052  WBC 10.5 6.7  HGB 10.3* 9.8*  HCT 32.2* 30.9*  PLT 270 239   CMET CMP     Component Value Date/Time   NA 140 12/26/2020 0035   K 3.6 12/26/2020 0035   CL 104 12/26/2020 0035   CO2 29 12/26/2020 0035   GLUCOSE 111 (H) 12/26/2020 0035   BUN 24 (H) 12/26/2020 0035   CREATININE 1.08 (H) 12/26/2020 0035   CALCIUM 8.3 (L) 12/26/2020 0035   PROT 7.7 12/23/2020 1800   ALBUMIN 3.7 12/23/2020 1800   AST 22 12/23/2020 1800   ALT 15 12/23/2020 1800   ALKPHOS 48 12/23/2020 1800   BILITOT 0.5 12/23/2020 1800   GFRNONAA 46 (L) 12/26/2020 0035    GFR CrCl cannot be calculated (Unknown  ideal weight.). Recent Labs    12/23/20 1800  LIPASE 26   No results for input(s): CKTOTAL, CKMB, CKMBINDEX, TROPONINI in the last 72 hours. Invalid input(s): POCBNP No results for input(s): DDIMER in the last 72 hours. No results for input(s): HGBA1C in the last 72 hours. No results for input(s): CHOL, HDL, LDLCALC, TRIG, CHOLHDL, LDLDIRECT in the last 72 hours. No results for input(s): TSH, T4TOTAL, T3FREE, THYROIDAB in the last 72 hours.  Invalid input(s): FREET3 No results for input(s): VITAMINB12, FOLATE, FERRITIN, TIBC, IRON, RETICCTPCT in the last 72 hours. Coags: No results for input(s): INR in the last 72 hours.  Invalid input(s): PT Microbiology: Recent Results (from the past 240 hour(s))  Resp Panel by RT-PCR (Flu A&B, Covid) Nasopharyngeal Swab     Status: None   Collection Time: 12/23/20  6:05 PM   Specimen: Nasopharyngeal Swab;  Nasopharyngeal(NP) swabs in vial transport medium  Result Value Ref Range Status   SARS Coronavirus 2 by RT PCR NEGATIVE NEGATIVE Final    Comment: (NOTE) SARS-CoV-2 target nucleic acids are NOT DETECTED.  The SARS-CoV-2 RNA is generally detectable in upper respiratory specimens during the acute phase of infection. The lowest concentration of SARS-CoV-2 viral copies this assay can detect is 138 copies/mL. A negative result does not preclude SARS-Cov-2 infection and should not be used as the sole basis for treatment or other patient management decisions. A negative result may occur with  improper specimen collection/handling, submission of specimen other than nasopharyngeal swab, presence of viral mutation(s) within the areas targeted by this assay, and inadequate number of viral copies(<138 copies/mL). A negative result must be combined with clinical observations, patient history, and epidemiological information. The expected result is Negative.  Fact Sheet for Patients:  BloggerCourse.com  Fact Sheet for  Healthcare Providers:  SeriousBroker.it  This test is no t yet approved or cleared by the [place] FDA and  has been authorized for detection and/or diagnosis of SARS-CoV-2 by FDA under an Emergency Use Authorization (EUA). This EUA will remain  in effect (meaning this test can be used) for the duration of the COVID-19 declaration under Section 564(b)(1) of the Act, 21 U.S.C.section 360bbb-3(b)(1), unless the authorization is terminated  or revoked sooner.       Influenza A by PCR NEGATIVE NEGATIVE Final   Influenza B by PCR NEGATIVE NEGATIVE Final    Comment: (NOTE) The Xpert Xpress SARS-CoV-2/FLU/RSV plus assay is intended as an aid in the diagnosis of influenza from Nasopharyngeal swab specimens and should not be used as a sole basis for treatment. Nasal washings and aspirates are unacceptable for Xpert Xpress SARS-CoV-2/FLU/RSV testing.  Fact Sheet for Patients: BloggerCourse.com  Fact Sheet for Healthcare Providers: SeriousBroker.it  This test is not yet approved or cleared by the [place] FDA and has been authorized for detection and/or diagnosis of SARS-CoV-2 by FDA under an Emergency Use Authorization (EUA). This EUA will remain in effect (meaning this test can be used) for the duration of the COVID-19 declaration under Section 564(b)(1) of the Act, 21 U.S.C. section 360bbb-3(b)(1), unless the authorization is terminated or revoked.  Performed at [facility], [address], [place], [place] [address]     FURTHER DISCHARGE INSTRUCTIONS:  Get Medicines reviewed and adjusted: Please take all your medications with you for your next visit with your Primary MD  Laboratory/radiological data: Please request your Primary MD to go over all hospital tests and procedure/radiological results at the follow up, please ask your Primary MD to get all Hospital records sent to his/her  office.  In some cases, they will be blood work, cultures and biopsy results pending at the time of your discharge. Please request that your primary care M.D. goes through all the records of your hospital data and follows up on these results.  Also Note the following: If you experience worsening of your admission symptoms, develop shortness of breath, life threatening emergency, suicidal or homicidal thoughts you must seek medical attention immediately by calling 911 or calling your MD immediately  if symptoms less severe.  You must read complete instructions/literature along with all the possible adverse reactions/side effects for all the Medicines you take and that have been prescribed to you. Take any new Medicines after you have completely understood and accpet all the possible adverse reactions/side effects.   Do not drive when taking Pain medications or sleeping  medications (Benzodaizepines)  Do not take more than prescribed Pain, Sleep and Anxiety Medications. It is not advisable to combine anxiety,sleep and pain medications without talking with your primary care practitioner  Special Instructions: If you have smoked or chewed Tobacco  in the last 2 yrs please stop smoking, stop any regular Alcohol  and or any Recreational drug use.  Wear Seat belts while driving.  Please note: You were cared for by a hospitalist during your hospital stay. Once you are discharged, your primary care physician will handle any further medical issues. Please note that NO REFILLS for any discharge medications will be authorized once you are discharged, as it is imperative that you return to your primary care physician (or establish a relationship with a primary care physician if you do not have one) for your post hospital discharge needs so that they can reassess your need for medications and monitor your lab values.  Total Time spent coordinating discharge including counseling, education and face to face time  equals 35 minutes.  SignedJeoffrey Massed 12/26/2020 9:33 AM

## 2020-12-27 LAB — GLUCOSE, CAPILLARY
Glucose-Capillary: 111 mg/dL — ABNORMAL HIGH (ref 70–99)
Glucose-Capillary: 117 mg/dL — ABNORMAL HIGH (ref 70–99)

## 2020-12-27 MED ORDER — LEVEMIR FLEXTOUCH 100 UNIT/ML ~~LOC~~ SOPN: 8.0000 [IU] | PEN_INJECTOR | Freq: Every morning | SUBCUTANEOUS | 11 refills | Status: AC

## 2020-12-27 MED ORDER — SENNOSIDES-DOCUSATE SODIUM 8.6-50 MG PO TABS: 2.0000 | ORAL_TABLET | Freq: Every evening | ORAL | Status: AC | PRN

## 2020-12-27 MED ORDER — HUMALOG KWIKPEN 100 UNIT/ML ~~LOC~~ SOPN: 5.0000 [IU] | PEN_INJECTOR | Freq: Two times a day (BID) | SUBCUTANEOUS | 11 refills | Status: AC

## 2020-12-27 MED ORDER — POLYETHYLENE GLYCOL 3350 17 G PO PACK: PACK | ORAL | 0 refills | Status: AC

## 2020-12-27 MED ORDER — BISACODYL 10 MG RE SUPP: 10.0000 mg | Freq: Every day | RECTAL | 0 refills | Status: AC | PRN

## 2020-12-27 NOTE — Progress Notes (Signed)
Patient discharged to home with instructions given to her pt's daughter, transported via [facility] transporter.

## 2020-12-27 NOTE — Care Management Important Message (Signed)
Important Message  Patient Details  Name: Denise Woodward MRN: [id number] Date of Birth: June 07, 1919   Medicare Important Message Given:  Yes     Dorena Bodo 12/27/2020, 12:19 PM

## 2020-12-27 NOTE — Care Management Important Message (Signed)
Important Message  Patient Details  Name: Denise Woodward MRN: [id number] Date of Birth: 1919-03-31   Medicare Important Message Given:  Yes     Dorena Bodo 12/27/2020, 3:38 PM

## 2020-12-27 NOTE — Social Work (Signed)
Patient will DC to: [address] [place] [address] Anticipated DC date: 12/27/20 Family notified: Daughter Transport by: Hubert Azure at 12:30pm     Per MD patient ready for DC to Home. RN, patient, patient's family have been notified of DC. Ambulance transport requested for patient.    CSW will sign off for now as social work intervention is no longer needed. Please consult [place] again if new needs arise.  Jimmy Picket, LCSW Clinical Social Worker

## 2020-12-27 NOTE — Progress Notes (Signed)
No vomiting Several BM overnight  Blood pressure 134/60, pulse 72, temperature 98.6 F (37 C), temperature source Oral, resp. rate 19, SpO2 90 %.   Abdomen: Soft  Impression: Resolved partial SBO  Plan: Discharge home today  See discharge summary for further details.

## 2020-12-27 NOTE — Discharge Summary (Signed)
PATIENT DETAILS Name: Denise Woodward Age: 87 y.o. Sex: female Date of Birth: 1919/12/03 MRN: [id number]. Admitting Physician: Maretta Bees, MD HTD:[id number], Loraine Leriche, MD  Admit Date: 12/23/2020 Discharge date: 12/27/2020  Recommendations for Outpatient Follow-up:  Follow up with PCP in 1-2 weeks  Admitted From:  Home  Disposition: Home   Home Health: No  Equipment/Devices: None  Discharge Condition: Stable  CODE STATUS:  DNR  Diet recommendation:  Diet Order             Diet - low sodium heart healthy           DIET - DYS 1 Room service appropriate? Yes; Fluid consistency: Thin  Diet effective now                    Brief Summary: Patient is a 87 y.o. female with history of advanced dementia, IDDM-2, HTN, HLD, hypothyroidism, colon cancer-s/p partial colectomy-who presented to the ED on 10/14 with nausea/vomiting and abdominal pain-found to have partial small bowel obstruction.  She recently had URI-like symptoms and was on antimicrobial therapy prior to this hospitalization.    Brief Hospital Course: Partial small bowel obstruction: Resolved with supportive care-no vomiting since Friday 1 PM-passing small BMs this morning.  Tolerating advancement in diet.  Stable for discharge.  AKI: Hemodynamically mediated-significantly improved.  Continue periodic monitoring in the outpatient setting.  Constipation: Required MiraLAX/senna and then enema x2-had several BMs overnight.  We will continue on a bowel regimen on discharge.     Insulin-dependent DM-2: CBGs were stable-plan is to slowly increase her insulin regimen depending on her diet and CBGs-Long discussion with daughter-see medication list below.  HTN: BP stable-resume usual antihypertensive on discharge.   Hypothyroidism: On levothyroxine intravenously-switch to oral route on discharge.   HLD: Resume statin    Dementia: At baseline-resume Depakote/Paxil/Remeron.    Debility/frailty:  Severe/advanced dementia-bedbound-dependent on family for all daily activities of living   Goals of care: DNR in place-family wants only gentle medical treatment.  Unclear whether family will consent to NG tube.  They are clear that they would not want a laparotomy/surgical intervention in case her bowel obstruction worsens and aware that if we do get to that point-apart from initiation of hospice care-we really would not have any further options.   Procedures None  Discharge Diagnoses:  Principal Problem:   Partial small bowel obstruction (HCC) Active Problems:   Advanced dementia   Hyperlipidemia associated with type 2 diabetes mellitus (HCC)   Hypertension associated with diabetes (HCC)   Hypothyroidism   Insulin dependent type 2 diabetes mellitus (HCC)   Renal insufficiency   SBO (small bowel obstruction) (HCC)   Discharge Instructions:  Activity:  As tolerated  Discharge Instructions     Diet - low sodium heart healthy   Complete by: As directed    Discharge instructions   Complete by: As directed    Follow with Primary MD  Rodrigo Ran, MD in 1-2 weeks  Please get a complete blood count and chemistry panel checked by your Primary MD at your next visit, and again as instructed by your Primary MD.  Get Medicines reviewed and adjusted: Please take all your medications with you for your next visit with your Primary MD  Laboratory/radiological data: Please request your Primary MD to go over all hospital tests and procedure/radiological results at the follow up, please ask your Primary MD to get all Hospital records sent to his/her office.  In some cases, they will  be blood work, cultures and biopsy results pending at the time of your discharge. Please request that your primary care M.D. follows up on these results.  Also Note the following: If you experience worsening of your admission symptoms, develop shortness of breath, life threatening emergency, suicidal or  homicidal thoughts you must seek medical attention immediately by calling 911 or calling your MD immediately  if symptoms less severe.  You must read complete instructions/literature along with all the possible adverse reactions/side effects for all the Medicines you take and that have been prescribed to you. Take any new Medicines after you have completely understood and accpet all the possible adverse reactions/side effects.   Do not drive when taking Pain medications or sleeping medications (Benzodaizepines)  Do not take more than prescribed Pain, Sleep and Anxiety Medications. It is not advisable to combine anxiety,sleep and pain medications without talking with your primary care practitioner  Special Instructions: If you have smoked or chewed Tobacco  in the last 2 yrs please stop smoking, stop any regular Alcohol  and or any Recreational drug use.  Wear Seat belts while driving.  Please note: You were cared for by a hospitalist during your hospital stay. Once you are discharged, your primary care physician will handle any further medical issues. Please note that NO REFILLS for any discharge medications will be authorized once you are discharged, as it is imperative that you return to your primary care physician (or establish a relationship with a primary care physician if you do not have one) for your post hospital discharge needs so that they can reassess your need for medications and monitor your lab values.   Increase activity slowly   Complete by: As directed       Allergies as of 12/27/2020       Reactions   Doxycycline Swelling   Causes tongue and lip swelling   Aspirin    Doxycycline    Lip swelling        Medication List     STOP taking these medications    cefdinir 300 MG capsule Commonly known as: OMNICEF       TAKE these medications    acetaminophen 500 MG tablet Commonly known as: TYLENOL Take 500 mg by mouth every 6 (six) hours as needed for moderate  pain or headache.   atorvastatin 10 MG tablet Commonly known as: LIPITOR Take 10 mg by mouth daily. What changed: Another medication with the same name was removed. Continue taking this medication, and follow the directions you see here.   B-D ULTRAFINE III SHORT PEN 31G X 8 MM Misc Generic drug: Insulin Pen Needle SMARTSIG:Syringe(s) SUB-Q What changed: Another medication with the same name was removed. Continue taking this medication, and follow the directions you see here.   benazepril 5 MG tablet Commonly known as: LOTENSIN Take 5 mg by mouth daily. What changed: Another medication with the same name was removed. Continue taking this medication, and follow the directions you see here.   bisacodyl 10 MG suppository Commonly known as: DULCOLAX Place 1 suppository (10 mg total) rectally daily as needed (If no BM with both MiraLAX and senna use.).   divalproex 125 MG capsule Commonly known as: DEPAKOTE SPRINKLE Take 250 mg by mouth at bedtime. What changed: Another medication with the same name was removed. Continue taking this medication, and follow the directions you see here.   donepezil 10 MG tablet Commonly known as: ARICEPT Take 10 mg by mouth at bedtime. What changed:  Another medication with the same name was removed. Continue taking this medication, and follow the directions you see here.   HumaLOG KwikPen 100 UNIT/ML KwikPen Generic drug: insulin lispro Inject 5 Units into the skin 2 (two) times daily with a meal. Start when blood sugars are consistently more than 200 What changed:  additional instructions Another medication with the same name was removed. Continue taking this medication, and follow the directions you see here.   Levemir FlexTouch 100 UNIT/ML FlexPen Generic drug: insulin detemir Inject 8 Units into the skin in the morning. Start with 8 units of Levemir daily when sugars consistently more than 150-and slowly increase by 3-4 units (if her sugars remain  close to 200 with 8 units of Levemir) to her usual regimen of 16 units daily. What changed:  how much to take additional instructions Another medication with the same name was removed. Continue taking this medication, and follow the directions you see here.   metoprolol tartrate 25 MG tablet Commonly known as: LOPRESSOR Take 25 mg by mouth 2 (two) times daily. What changed: Another medication with the same name was removed. Continue taking this medication, and follow the directions you see here.   mirtazapine 15 MG tablet Commonly known as: REMERON Take 15 mg by mouth at bedtime. What changed: Another medication with the same name was removed. Continue taking this medication, and follow the directions you see here.   PARoxetine 10 MG tablet Commonly known as: PAXIL Take 10 mg by mouth daily. What changed: Another medication with the same name was removed. Continue taking this medication, and follow the directions you see here.   polyethylene glycol 17 g packet Commonly known as: MIRALAX / GLYCOLAX Take 17 g p.o. daily.  If patient does not have a bowel movement with daily dosing, switch to twice daily dosing.   senna-docusate 8.6-50 MG tablet Commonly known as: Senokot-S Take 2 tablets by mouth at bedtime as needed for mild constipation. If no BM with MiraLAX twice a day dosing.   Synthroid 75 MCG tablet Generic drug: levothyroxine Take 75 mcg by mouth daily. What changed: Another medication with the same name was removed. Continue taking this medication, and follow the directions you see here.   Vitamin D (Ergocalciferol) 1.25 MG (50000 UNIT) Caps capsule Commonly known as: DRISDOL Take 50,000 Units by mouth every Thursday. What changed: Another medication with the same name was removed. Continue taking this medication, and follow the directions you see here.        Follow-up Information     Rodrigo Ran, MD. Schedule an appointment as soon as possible for a visit in 1  week(s).   Specialty: Internal Medicine Contact information: [address] [place] [address] [phone]                Allergies  Allergen Reactions   Doxycycline Swelling    Causes tongue and lip swelling   Aspirin    Doxycycline     Lip swelling    Consultations:  None   Other Procedures/Studies: CT ABDOMEN PELVIS W CONTRAST  Result Date: 12/23/2020 CLINICAL DATA:  Bowel obstruction suspected. Colon cancer status post surgery EXAM: CT ABDOMEN AND PELVIS WITH CONTRAST TECHNIQUE: Multidetector CT imaging of the abdomen and pelvis was performed using the standard protocol following bolus administration of intravenous contrast. CONTRAST:  60mL OMNIPAQUE IOHEXOL 300 MG/ML  SOLN COMPARISON:  None. FINDINGS: Lower chest: Bilateral lower lobe atelectasis. Bronchial wall thickening. Hepatobiliary: No focal liver abnormality. Calcified gallstones noted  layering within the gallbladder lumen. No gallbladder wall thickening or pericholecystic fluid. No biliary dilatation. Pancreas: No focal lesion. Normal pancreatic contour. No surrounding inflammatory changes. No main pancreatic ductal dilatation. Spleen: Normal in size without focal abnormality. Adrenals/Urinary Tract: No adrenal nodule bilaterally. Bilateral kidneys enhance symmetrically. No hydronephrosis. No hydroureter. The urinary bladder is grossly unremarkable with limited evaluation due to streak artifact. Stomach/Bowel: Surgical changes related to sigmoid resection. Stomach is within normal limits. Several loops of proximal mid small bowel mildly distended/dilated with fluid. No associated transition point. No small bowel thickening. No pneumatosis. No evidence of large bowel wall thickening or dilatation. Scattered colonic diverticulosis. The appendix is not definitely identified and may be surgically absent. Vascular/Lymphatic: No abdominal aorta or iliac aneurysm. Mild atherosclerotic plaque of the aorta and its branches.  No abdominal, pelvic, or inguinal lymphadenopathy. Reproductive: Uterus and bilateral adnexa are unremarkable. Nonspecific left adnexal region calcification. Other: No intraperitoneal free fluid. No intraperitoneal free gas. No organized fluid collection. Musculoskeletal: Moderate to large volume fat containing infraumbilical lower abdominal ventral wall hernia. Small fat containing right inguinal hernia. No suspicious lytic or blastic osseous lesions. No acute displaced fracture. Grade 1 anterolisthesis of L4 on L5. Bilateral total hip arthroplasties partially visualized. Other: Limited evaluation of the pelvis due to streak artifact originating from bilateral surgical femoral hardware. IMPRESSION: 1. Several loops of proximal mid small bowel mildly distended/dilated with fluid. No transition point identified. Finding could represent an ileus versus developing early/partial small bowel obstruction. 2. Moderate to large volume fat containing infraumbilical lower abdominal ventral wall hernia. No associated findings to suggest ischemia or cause of a bowel obstruction. 3. Scattered colonic diverticulosis with no acute diverticulitis in a patient status post sigmoid resection. 4. Cholelithiasis no findings of acute cholecystitis. Electronically Signed   By: Tish Frederickson M.D.   On: 12/23/2020 20:52   DG Chest Portable 1 View  Result Date: 12/23/2020 CLINICAL DATA:  Cough, on cefdinir to prevent pneumonia, now nausea, vomiting, abdominal pain. Concern for either aspiration, bowel obstruction or pneumonia EXAM: PORTABLE CHEST 1 VIEW COMPARISON:  None. FINDINGS: Abnormal aortic arch contour likely due to positioning. Otherwise the heart and mediastinal contours are within normal limits. Limited evaluation of the left base due to patient positioning. No focal consolidation. No pulmonary edema. No pleural effusion. No pneumothorax. No acute osseous abnormality. IMPRESSION: 1. Abnormal aortic arch contour likely due  to positioning. 2. Limited evaluation of the left base due to patient positioning. 3. Recommend repeat PA and lateral view of the chest for further evaluation. Electronically Signed   By: Tish Frederickson M.D.   On: 12/23/2020 18:30   DG Abd Portable 1V  Result Date: 12/24/2020 CLINICAL DATA:  Partial small bowel obstruction. EXAM: PORTABLE ABDOMEN - 1 VIEW COMPARISON:  CT abdomen dated 12/23/2020. FINDINGS: Mildly distended gas-filled small bowel loops within the LEFT lower quadrant, measuring up to 3.8 cm diameter. No evidence of free intraperitoneal air is seen. Intravascular contrast material within the collecting system/bladder from earlier CT abdomen and pelvis. Bilateral hip prostheses in place. IMPRESSION: Mildly distended gas-filled small bowel loops within the LEFT lower quadrant, measuring up to 3.8 cm diameter, compatible with the given history of partial small bowel obstruction. Electronically Signed   By: Bary Richard M.D.   On: 12/24/2020 06:40     TODAY-DAY OF DISCHARGE:  Subjective:   Newman Nip today has no headache,no chest abdominal pain,no new weakness tingling or numbness, feels much better wants to go home today.  Objective:   Blood pressure 134/60, pulse 72, temperature 98.6 F (37 C), temperature source Oral, resp. rate 19, SpO2 90 %.  Intake/Output Summary (Last 24 hours) at 12/27/2020 0843 Last data filed at 12/26/2020 1950 Gross per 24 hour  Intake --  Output 150 ml  Net -150 ml   There were no vitals filed for this visit.  Exam: Awake Alert, Oriented *3, No new F.N deficits, Normal affect [place].AT,PERRAL Supple Neck,No JVD, No cervical lymphadenopathy appriciated.  Symmetrical Chest wall movement, Good air movement bilaterally, CTAB RRR,No Gallops,Rubs or new Murmurs, No Parasternal Heave +ve B.Sounds, Abd Soft, Non tender, No organomegaly appriciated, No rebound -guarding or rigidity. No Cyanosis, Clubbing or edema, No new Rash or  bruise   PERTINENT RADIOLOGIC STUDIES: No results found.   PERTINENT LAB RESULTS: CBC: Recent Labs    12/25/20 0052  WBC 6.7  HGB 9.8*  HCT 30.9*  PLT 239   CMET CMP     Component Value Date/Time   NA 140 12/26/2020 0035   K 3.6 12/26/2020 0035   CL 104 12/26/2020 0035   CO2 29 12/26/2020 0035   GLUCOSE 111 (H) 12/26/2020 0035   BUN 24 (H) 12/26/2020 0035   CREATININE 1.08 (H) 12/26/2020 0035   CALCIUM 8.3 (L) 12/26/2020 0035   PROT 7.7 12/23/2020 1800   ALBUMIN 3.7 12/23/2020 1800   AST 22 12/23/2020 1800   ALT 15 12/23/2020 1800   ALKPHOS 48 12/23/2020 1800   BILITOT 0.5 12/23/2020 1800   GFRNONAA 46 (L) 12/26/2020 0035    GFR CrCl cannot be calculated (Unknown ideal weight.). No results for input(s): LIPASE, AMYLASE in the last 72 hours.  No results for input(s): CKTOTAL, CKMB, CKMBINDEX, TROPONINI in the last 72 hours. Invalid input(s): POCBNP No results for input(s): DDIMER in the last 72 hours. No results for input(s): HGBA1C in the last 72 hours. No results for input(s): CHOL, HDL, LDLCALC, TRIG, CHOLHDL, LDLDIRECT in the last 72 hours. No results for input(s): TSH, T4TOTAL, T3FREE, THYROIDAB in the last 72 hours.  Invalid input(s): FREET3 No results for input(s): VITAMINB12, FOLATE, FERRITIN, TIBC, IRON, RETICCTPCT in the last 72 hours. Coags: No results for input(s): INR in the last 72 hours.  Invalid input(s): PT Microbiology: Recent Results (from the past 240 hour(s))  Resp Panel by RT-PCR (Flu A&B, Covid) Nasopharyngeal Swab     Status: None   Collection Time: 12/23/20  6:05 PM   Specimen: Nasopharyngeal Swab; Nasopharyngeal(NP) swabs in vial transport medium  Result Value Ref Range Status   SARS Coronavirus 2 by RT PCR NEGATIVE NEGATIVE Final    Comment: (NOTE) SARS-CoV-2 target nucleic acids are NOT DETECTED.  The SARS-CoV-2 RNA is generally detectable in upper respiratory specimens during the acute phase of infection. The  lowest concentration of SARS-CoV-2 viral copies this assay can detect is 138 copies/mL. A negative result does not preclude SARS-Cov-2 infection and should not be used as the sole basis for treatment or other patient management decisions. A negative result may occur with  improper specimen collection/handling, submission of specimen other than nasopharyngeal swab, presence of viral mutation(s) within the areas targeted by this assay, and inadequate number of viral copies(<138 copies/mL). A negative result must be combined with clinical observations, patient history, and epidemiological information. The expected result is Negative.  Fact Sheet for Patients:  BloggerCourse.com  Fact Sheet for Healthcare Providers:  SeriousBroker.it  This test is no t yet approved or cleared by the [place] FDA and  has  been authorized for detection and/or diagnosis of SARS-CoV-2 by FDA under an Emergency Use Authorization (EUA). This EUA will remain  in effect (meaning this test can be used) for the duration of the COVID-19 declaration under Section 564(b)(1) of the Act, 21 U.S.C.section 360bbb-3(b)(1), unless the authorization is terminated  or revoked sooner.       Influenza A by PCR NEGATIVE NEGATIVE Final   Influenza B by PCR NEGATIVE NEGATIVE Final    Comment: (NOTE) The Xpert Xpress SARS-CoV-2/FLU/RSV plus assay is intended as an aid in the diagnosis of influenza from Nasopharyngeal swab specimens and should not be used as a sole basis for treatment. Nasal washings and aspirates are unacceptable for Xpert Xpress SARS-CoV-2/FLU/RSV testing.  Fact Sheet for Patients: BloggerCourse.com  Fact Sheet for Healthcare Providers: SeriousBroker.it  This test is not yet approved or cleared by the [place] FDA and has been authorized for detection and/or diagnosis of SARS-CoV-2 by FDA under  an Emergency Use Authorization (EUA). This EUA will remain in effect (meaning this test can be used) for the duration of the COVID-19 declaration under Section 564(b)(1) of the Act, 21 U.S.C. section 360bbb-3(b)(1), unless the authorization is terminated or revoked.  Performed at [facility], [address] [place], [place] [address]     FURTHER DISCHARGE INSTRUCTIONS:  Get Medicines reviewed and adjusted: Please take all your medications with you for your next visit with your Primary MD  Laboratory/radiological data: Please request your Primary MD to go over all hospital tests and procedure/radiological results at the follow up, please ask your Primary MD to get all Hospital records sent to his/her office.  In some cases, they will be blood work, cultures and biopsy results pending at the time of your discharge. Please request that your primary care M.D. goes through all the records of your hospital data and follows up on these results.  Also Note the following: If you experience worsening of your admission symptoms, develop shortness of breath, life threatening emergency, suicidal or homicidal thoughts you must seek medical attention immediately by calling 911 or calling your MD immediately  if symptoms less severe.  You must read complete instructions/literature along with all the possible adverse reactions/side effects for all the Medicines you take and that have been prescribed to you. Take any new Medicines after you have completely understood and accpet all the possible adverse reactions/side effects.   Do not drive when taking Pain medications or sleeping medications (Benzodaizepines)  Do not take more than prescribed Pain, Sleep and Anxiety Medications. It is not advisable to combine anxiety,sleep and pain medications without talking with your primary care practitioner  Special Instructions: If you have smoked or chewed Tobacco  in the last 2 yrs please stop smoking,  stop any regular Alcohol  and or any Recreational drug use.  Wear Seat belts while driving.  Please note: You were cared for by a hospitalist during your hospital stay. Once you are discharged, your primary care physician will handle any further medical issues. Please note that NO REFILLS for any discharge medications will be authorized once you are discharged, as it is imperative that you return to your primary care physician (or establish a relationship with a primary care physician if you do not have one) for your post hospital discharge needs so that they can reassess your need for medications and monitor your lab values.  Total Time spent coordinating discharge including counseling, education and face to face time equals 35 minutes.  Signed:  Joselyn Edling 12/27/2020 8:43 AM

## 2020-12-27 NOTE — Discharge Instructions (Signed)
Constipation regimen: Take MiraLAX once a day, If no BM in 1 day-change MiraLAX to twice a day, If no BM in 1 day-add senna If no BM in 1 day-try Dulcolax suppository

## 2021-01-16 ENCOUNTER — Other Ambulatory Visit: Payer: Self-pay

## 2021-01-16 ENCOUNTER — Other Ambulatory Visit: Payer: Medicare Other | Admitting: *Deleted

## 2021-01-16 DIAGNOSIS — Z515 Encounter for palliative care: Secondary | ICD-10-CM

## 2021-01-17 NOTE — Progress Notes (Signed)
[facility] RN NOTE  PATIENT NAME: Denise Woodward DOB: 1920-02-10 MRN: [id number]  PRIMARY CARE PROVIDER: Rodrigo Ran, MD  RESPONSIBLE PARTY: [name] (daughter) Acct ID - Guarantor Home Phone Work Phone Relationship Acct Type  [id number] LAMEEKA, SCHLEIFER* [phone]  Self P/F     [address] [place], [place] [address]   Due to the COVID-19 crisis, this virtual check-in visit was done via telephone from my office and it was initiated and consent by this patient and or family.  PLAN OF CARE and INTERVENTION:  ADVANCE CARE PLANNING/GOALS OF CARE: Goal is for patient to remain in the home with her daughter. She has a DNR. PATIENT/CAREGIVER EDUCATION: N/A DISEASE STATUS: Virtual check-in visit completed via telephone. Patient is not showing any physical indicators of pain. She was recently hospitalized from 12/23/20 to 12/27/20 for a partial small bowel obstruction. She also had some coughing and congestion on my last visit a few weeks ago. Daughter says that patient is no longer coughing and that her bowels are moving again. She is back to eating her regular diet. She takes her medications crushed in applesauce. She remains total care for all ADLs and transferred via [facility] lift. She continues with sleep wake cycles lasting for about 24-48 hours. She is incontinent of both bowel and bladder and wears adult briefs. They continue to apply Desitin to the redness on her bottom. She has a pharmacist coming tomorrow to administer the flu shot to patient and she will come back next week to administer her Covid booster. Daughter has no concerns at this time.  HISTORY OF PRESENT ILLNESS:  This is a 87 yo female with a h/o dementia, HTN, DM II, hyperlipidemia and hypothyroidism. Palliative care team continues to follow patient for assistance with symptom management, goals of care and complex decision making.   CODE STATUS: DNR ADVANCED DIRECTIVES: Y MOST FORM: no PPS:  30%   (Duration of visit and documentation 15 minutes)   Candiss Norse, RN BSN

## 2021-01-23 ENCOUNTER — Encounter (HOSPITAL_COMMUNITY): Payer: Self-pay | Admitting: Radiology

## 2021-01-30 ENCOUNTER — Ambulatory Visit: Payer: Self-pay

## 2021-01-30 NOTE — Telephone Encounter (Signed)
Pt's Daughter called back, asking about incidental finding letter she received for mother. Advised her that she needs to follow up with PCP to discuss letter. She asked was this something that needed to be addressed immediately since she had 3 Mds in the hospital and everyone said her CXR was fine. I informed her that this wasn't urgent that they send out the letter 30 days after the results are given and can follow up on next visit if needed. No other questions/concerns noted.      Message from Gwenlyn Fudge sent at 01/30/2021  2:55 PM EST  Pts daughter called stating that they received an incidental findings letter and is requesting to have more info due to her PCP not having this scan or information for pt. Please advise.

## 2021-02-06 ENCOUNTER — Telehealth: Payer: Self-pay | Admitting: *Deleted

## 2021-02-06 NOTE — Telephone Encounter (Signed)
1:06p Received a communication from patient's daughter, [name]. She states that since patient has returned from the hospital earlier this month, she has been checking her blood sugars on a daily basis each morning. She is currently out of patient's One Touch Ultra testing strips and says that she doesn't have any refills due to the prescription being from long ago.   2:53p I contacted and spoke with our palliative MD, Dr. Renato Gails to request refills on these test strips and she agreed for me to call this in to patient's pharmacy.  3:01p Called and spoke with the pharmacist at [facility] and ordered test strips with 3 refills.  3:07p Sent communication to patient's daughter to advise that I have called in this prescription. She is appreciative.

## 2021-03-13 ENCOUNTER — Other Ambulatory Visit: Payer: Medicare Other | Admitting: *Deleted

## 2021-03-13 DIAGNOSIS — Z515 Encounter for palliative care: Secondary | ICD-10-CM

## 2021-03-13 NOTE — Progress Notes (Signed)
[facility] RN NOTE  PATIENT NAME: Denise Woodward DOB: 12/18/1919 MRN: [id number]  PRIMARY CARE PROVIDER: Crist Infante, MD  RESPONSIBLE PARTY: [name] (daughter) Acct ID - Guarantor Home Phone Work Phone Relationship Acct Type  [id number] TANIQUA, [phone]  Self P/F     [place], [place], [place] [address]   Due to the COVID-19 crisis, this virtual check-in visit was done via telephone from my office and it was initiated and consent by this patient and or family.  PLAN OF CARE and INTERVENTION:  ADVANCE CARE PLANNING/GOALS OF CARE: Goal is for patient to remain in the home with her daughter. She has a DNR. PATIENT/CAREGIVER EDUCATION: N/A DISEASE STATUS: Virtual check-in visit completed with patient's daughter-[name]. Patient has returned back to her baseline since her hospitalizations 2 months ago for a small bowel obstruction. No physical indicators of pain. She continues with her sleep wake cycles lasting between 24-48 hours. During the times she is awake, she is very talkative. Her appetite is good. No dysphagia. She continues to take her medications crushed in applesauce. She is transferred via [place] and transported via wheelchair. They continue to get her up to a recliner on days where she is more awake. She is incontinent of both and bladder. She monitors her skin daily to assess for any breakdown. She has received both her flu vaccine and Covid Booster. No adverse reactions. Daughter continues with no concerns. Will continue to monitor.  HISTORY OF PRESENT ILLNESS:  This is a 88 yo female with a h/o dementia, HTN, DM II, hyperlipidemia and hypothyroidism. Palliative care team continues to follow patient for assistance with symptom management, goals of care and complex decision making.    CODE STATUS: DNR ADVANCED DIRECTIVES: Y MOST FORM: no PPS: 30%   (Duration of visit and documentation 20 minutes)    Daryl Eastern, RN  BSN

## 2021-03-14 ENCOUNTER — Other Ambulatory Visit: Payer: Self-pay

## 2021-03-17 ENCOUNTER — Other Ambulatory Visit: Payer: Medicare Other | Admitting: *Deleted

## 2021-03-17 ENCOUNTER — Other Ambulatory Visit: Payer: Self-pay

## 2021-03-17 DIAGNOSIS — Z515 Encounter for palliative care: Secondary | ICD-10-CM

## 2021-03-17 NOTE — Progress Notes (Signed)
[facility] RN NOTE  PATIENT NAME: Denise Woodward DOB: 13-Apr-1919 MRN: [id number]  PRIMARY CARE PROVIDER: Rodrigo Ran, MD  RESPONSIBLE PARTY: [name] (daughter) Acct ID - Guarantor Home Phone Work Phone Relationship Acct Type  [id number] NARA, PATERNOSTER* [phone]  Self P/F     [address], [place], [place] [address]   Due to the COVID-19 crisis, this virtual check-in visit was done via telephone from my office and it was initiated and consent by this patient and or family.  RN telephonic visit completed today. Patient's daughter-[name] is concerned regarding an area on her right buttocks. She says it was initially the size of a nickel, but has spread into the size of a quarter. She wants to know the best way to treat this. She was able to send me a picture through a secured line. The area is a dull pink. The top layer of skin has peeled off. "Peely" skin is noted to the perimeter. It does not appear to be infected. It is not swollen or warm. Patient is incontinent of both bowel and bladder. Consulted with our Palliative NP, Joycelyn Man. She advised to cleanse area gently with warm soap and water and apply a thick coating of Vaseline to the area for protection after each incontinent episode. Desitin was decided against as daughter states in the past patient had an allergic reaction to Zinc. She is to turn and reposition patient off this area and make sure the area is not rubbed but cleansed gently. NP does not feel that this area is infected at this time but we will continue to monitor. Daughter is appreciative of intervention.   (Duration of visit and documentation 30 minutes)   Candiss Norse, RN BSN

## 2021-04-24 ENCOUNTER — Other Ambulatory Visit: Payer: Medicare Other | Admitting: *Deleted

## 2021-04-24 ENCOUNTER — Other Ambulatory Visit: Payer: Self-pay

## 2021-04-24 DIAGNOSIS — Z515 Encounter for palliative care: Secondary | ICD-10-CM

## 2021-04-24 NOTE — Progress Notes (Signed)
[facility] RN NOTE  PATIENT NAME: Denise Woodward DOB: 1919/03/22 MRN: [id number]  PRIMARY CARE PROVIDER: Crist Infante, MD  RESPONSIBLE PARTY: [name] (daughter) Acct ID - Guarantor Home Phone Work Phone Relationship Acct Type  [id number] JAHNAYA, [phone]  Self P/F     [place], [place], [place] [address]   Due to the COVID-19 crisis, this virtual check-in visit was done via telephone from my office and it was initiated and consent by this patient and or family.  RN palliative care telephonic encounter completed with patient's daughter-[name]. She advised that the reddened area on her buttocks is looking much better. She continues with her sleep wake cycles every 24-48 hours. While awake she is very talkative. No physical indicators of pain are noticed. She continues with a good appetite. Her medications are given crushed in applesauce. She is total care with all ADLs. She is incontinent of both bowel and bladder. Her bowels are moving regularly. No s/s of obstruction. She is transferred via [place] to her wheelchair or recliner during the days where she is more awake/alert. No new issues or concerns at this time. Palliative care will continue to follow.    (Duration of visit and documentation 15 minutes)    Daryl Eastern, RN BSN

## 2021-05-31 ENCOUNTER — Other Ambulatory Visit: Payer: Self-pay

## 2021-05-31 ENCOUNTER — Other Ambulatory Visit: Payer: Medicare Other | Admitting: *Deleted

## 2021-05-31 DIAGNOSIS — Z515 Encounter for palliative care: Secondary | ICD-10-CM

## 2021-06-06 NOTE — Progress Notes (Signed)
[facility] RN NOTE ? ?PATIENT NAME: Denise Woodward ?DOB: 1919-03-19 ?MRN: [id number] ? ?PRIMARY CARE PROVIDER: Rodrigo Ran, MD ? ?RESPONSIBLE PARTY: [name] (daughter) ?Acct ID - Guarantor Home Phone Work Phone Relationship Acct Type  ?[id number] [facility]* [phone]  Self P/F  ?   [address] [place], [place] [address]  ? ?Due to the COVID-19 crisis, this virtual check-in visit was done via telephone from my office and it was initiated and consent by this patient and or family. ? ?Neuro: Continued wake/sleep cycles lasting 24-48 hours. Has dementia. ? ?Appetite: Good food/fluid intake. Requires feeding, Medications crushed and given in applesauce. No dysphagia. ? ?Mobility: Non-ambulatory, transferred via [employer] lift to wheelchair or recliner. Total care with all ADLs. ? ?Skin: Right buttocks redness improved ? ?Palliative care team will continue to follow. ? ?(Duration of visit and documentation 15 minutes) ? ? ?Candiss Norse, RN BSN ? ?

## 2021-07-07 ENCOUNTER — Other Ambulatory Visit: Payer: Medicare Other | Admitting: *Deleted

## 2021-07-07 DIAGNOSIS — Z515 Encounter for palliative care: Secondary | ICD-10-CM

## 2021-07-10 NOTE — Progress Notes (Signed)
[facility] RN NOTE ? ?PATIENT NAME: Denise Woodward ?DOB: 1919/06/20 ?MRN: [id number] ? ?PRIMARY CARE PROVIDER: Rodrigo Ran, MD ? ?RESPONSIBLE PARTY: [name] (daughter) ?Acct ID - Guarantor Home Phone Work Phone Relationship Acct Type  ?[id number] [facility]* [phone]  Self P/F  ?   [address] [place], [place] [address]  ? ?Due to the COVID-19 crisis, this virtual check-in visit was done via telephone from my office and it was initiated and consent by this patient and or family. ? ?RN telephonic encounter completed with patient's daughter [name]. She reports that patient's condition has remained stable. No new changes in condition or plan of care. She continues with sleep-wake cycles lasting 24-72 hours. No issues with pain or shortness of breath. Appetite remains good. No dysphagia. Remains total care. Transferred via [facility] lift to wheelchair. Up to recliner when awake. Incontinent of bowel and bladder. No concerns at this time. Palliative care will continue to follow.  ? ? ?Candiss Norse, RN ? ?

## 2021-07-14 DIAGNOSIS — M8580 Other specified disorders of bone density and structure, unspecified site: Secondary | ICD-10-CM | POA: Diagnosis not present

## 2021-07-14 DIAGNOSIS — F0390 Unspecified dementia without behavioral disturbance: Secondary | ICD-10-CM | POA: Diagnosis not present

## 2021-07-14 DIAGNOSIS — E039 Hypothyroidism, unspecified: Secondary | ICD-10-CM | POA: Diagnosis not present

## 2021-07-14 DIAGNOSIS — I10 Essential (primary) hypertension: Secondary | ICD-10-CM | POA: Diagnosis not present

## 2021-07-14 DIAGNOSIS — Z794 Long term (current) use of insulin: Secondary | ICD-10-CM | POA: Diagnosis not present

## 2021-07-14 DIAGNOSIS — R2689 Other abnormalities of gait and mobility: Secondary | ICD-10-CM | POA: Diagnosis not present

## 2021-07-14 DIAGNOSIS — E785 Hyperlipidemia, unspecified: Secondary | ICD-10-CM | POA: Diagnosis not present

## 2021-07-14 DIAGNOSIS — E1129 Type 2 diabetes mellitus with other diabetic kidney complication: Secondary | ICD-10-CM | POA: Diagnosis not present

## 2021-08-08 ENCOUNTER — Other Ambulatory Visit: Payer: Medicare Other | Admitting: *Deleted

## 2021-08-08 DIAGNOSIS — Z515 Encounter for palliative care: Secondary | ICD-10-CM

## 2021-08-08 NOTE — Progress Notes (Signed)
[facility] RN NOTE  PATIENT NAME: Denise Woodward DOB: 10-15-1919 MRN: [id number]  PRIMARY CARE PROVIDER: Rodrigo Ran, MD  RESPONSIBLE PARTY: [name] (daughter) Acct ID - Guarantor Home Phone Work Phone Relationship Acct Type  [id number] [name], [name]* [phone]  Self P/F     [address], [place], [place] [address]   Due to the COVID-19 crisis, this virtual check-in visit was done via telephone from my office and it was initiated and consent by this patient and or family.  RN telephonic encounter completed with patient's daughter-[name]. She reports that patient is stable and she has no concerns at this time. She continues with a sleep-wake cycle of 24-48 hours.  During wake cycles patient is very talkative. Speech mainly nonsensical. No physical indicators of pain. Continues with a good appetite. No issues with swallowing. Transferred via [employer]. Sits up in the recliner on days she is more awake. Incontinent of both bowel and bladder. Total care with ADLs. No skin issues at this time. Palliative care will continue to follow.  (Duration of visit and documentation 10 minutes)   Candiss Norse, RN BSN

## 2021-08-09 DIAGNOSIS — E1129 Type 2 diabetes mellitus with other diabetic kidney complication: Secondary | ICD-10-CM | POA: Diagnosis not present

## 2021-08-09 DIAGNOSIS — E785 Hyperlipidemia, unspecified: Secondary | ICD-10-CM | POA: Diagnosis not present

## 2021-09-13 ENCOUNTER — Other Ambulatory Visit: Payer: Medicare Other | Admitting: *Deleted

## 2021-09-13 DIAGNOSIS — Z515 Encounter for palliative care: Secondary | ICD-10-CM

## 2021-09-19 NOTE — Progress Notes (Signed)
[facility] RN NOTE  PATIENT NAME: Denise Woodward DOB: 04/18/1919 MRN: [id number]  PRIMARY CARE PROVIDER: Rodrigo Ran, MD  RESPONSIBLE PARTY: Denise Woodward (daughter) Acct ID - Guarantor Home Phone Work Phone Relationship Acct Type  [id number] Denise Woodward, ORSBORN* [phone]  Self P/F     [address], [place], [place] [address]   RN follow up telephonic encounter completed with patient's daughter Denise Woodward. She reports that there have been no recent changes in patient's condition. Also no changes in her medications or plan of care. She continues with sleep-wake cycles of about 24-48 hours. She is total care with all ADLs. Transferred via [employer]. Up to recliner when more alert. Appetite remains good. Medications given crushed in applesauce. Incontinent of both bowel and bladder. No active skin issues. She has no concerns at this time. Palliative care will continue to follow.    Denise Norse, RN BSN

## 2021-10-13 ENCOUNTER — Other Ambulatory Visit: Payer: Medicare Other | Admitting: *Deleted

## 2021-10-13 DIAGNOSIS — Z515 Encounter for palliative care: Secondary | ICD-10-CM

## 2021-11-06 IMAGING — CT CT ABD-PELV W/ CM
2 of 5 series · 15 of 46 positions shown, 17 images · IV contrast (omnipaque)
Comparison: None.

CLINICAL DATA: Bowel obstruction suspected. Colon cancer status
post surgery

EXAM:
CT ABDOMEN AND PELVIS WITH CONTRAST
TECHNIQUE: Multidetector CT imaging of the abdomen and pelvis was performed
using the standard protocol following bolus administration of
intravenous contrast.
CONTRAST:  60mL OMNIPAQUE IOHEXOL 300 MG/ML  SOLN

[Series 3: abd/ pelvis 5.0 i30f 2 · axial · 0.85mm/px · z∈[-415,+80]mm · 12 of 111 slices shown, 14 images]
[im 6/111  soft-tissue]
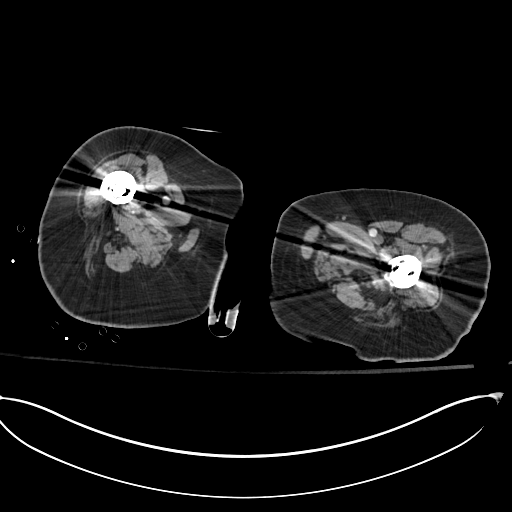
[im 6/111  bone]
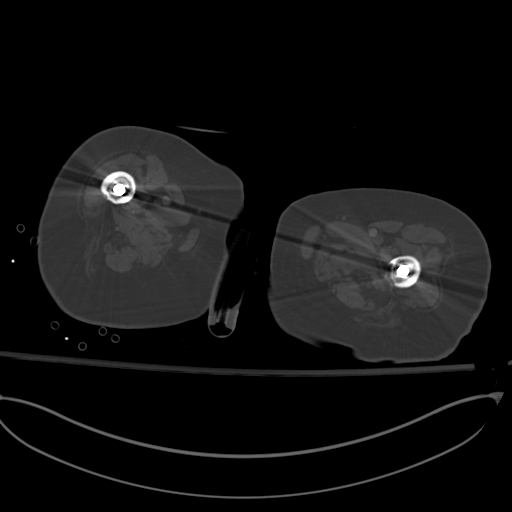
[im 16/111  soft-tissue]
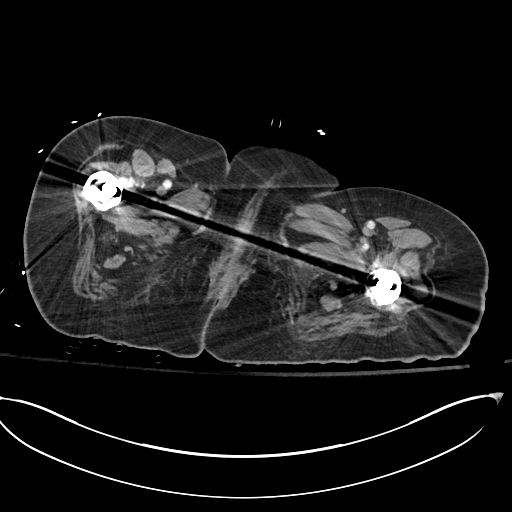
[im 21/111  soft-tissue]
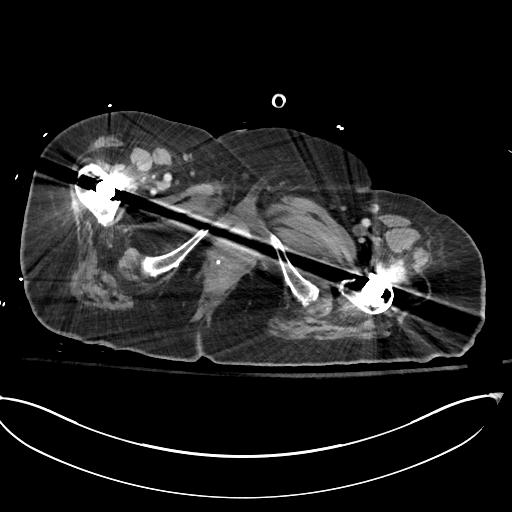
[im 37/111  soft-tissue]
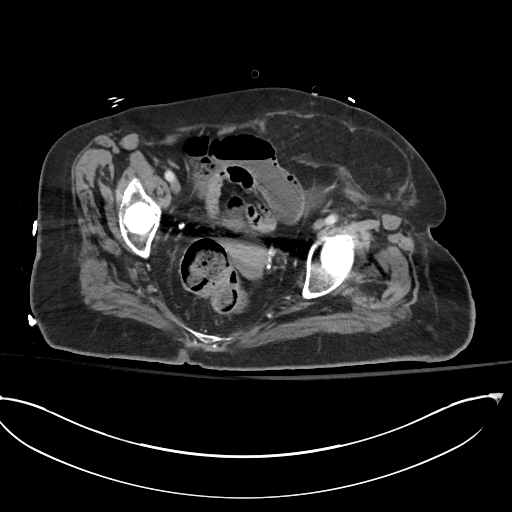
[im 48/111  soft-tissue]
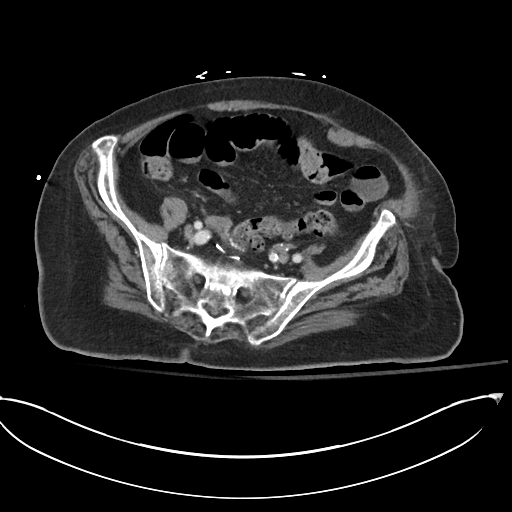
[im 53/111  soft-tissue]
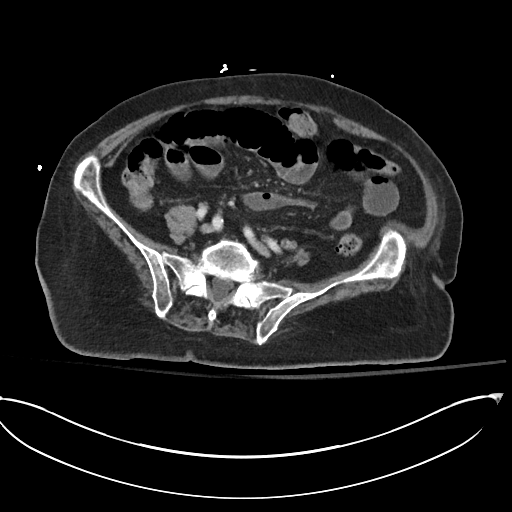
[im 63/111  soft-tissue]
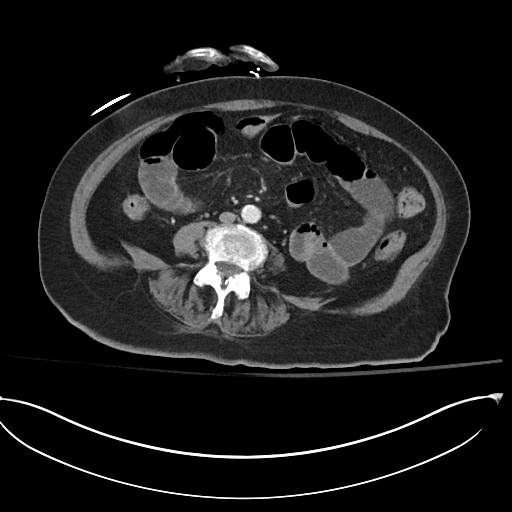
[im 69/111  soft-tissue]
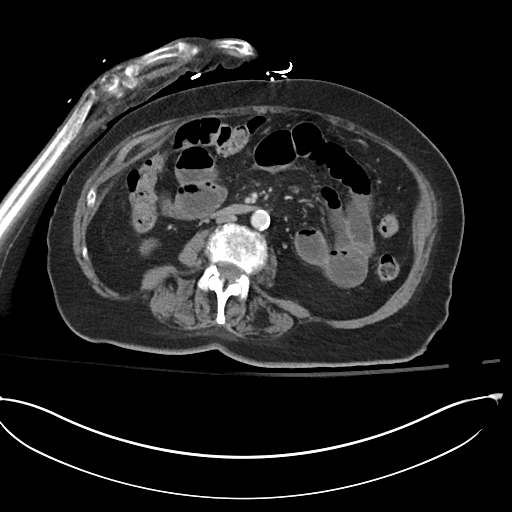
[im 79/111  soft-tissue]
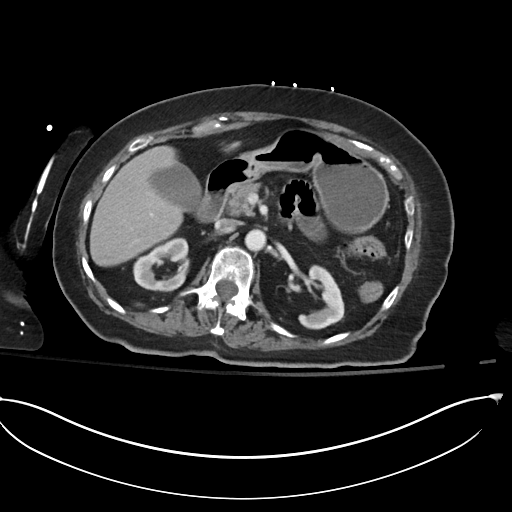
[im 79/111  bone]
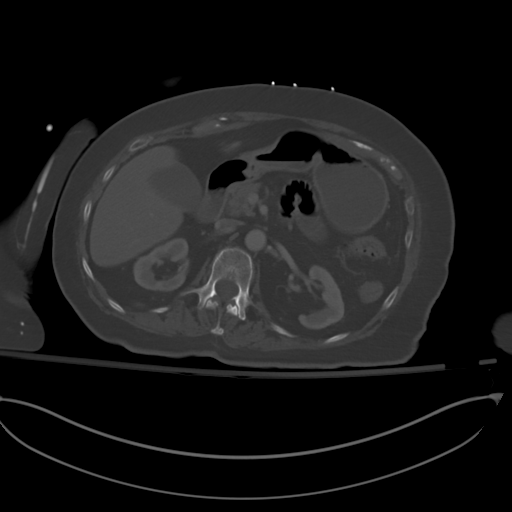
[im 90/111  soft-tissue]
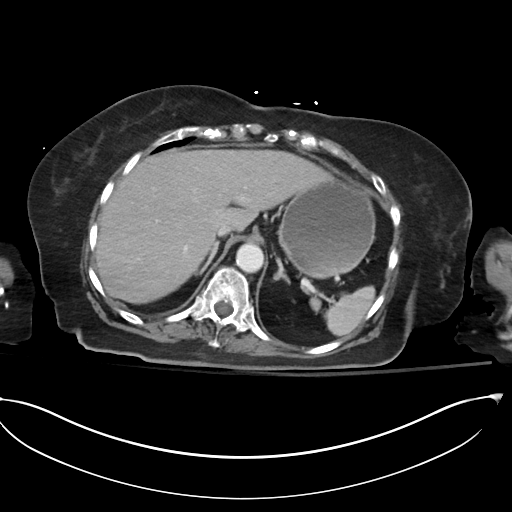
[im 95/111  soft-tissue]
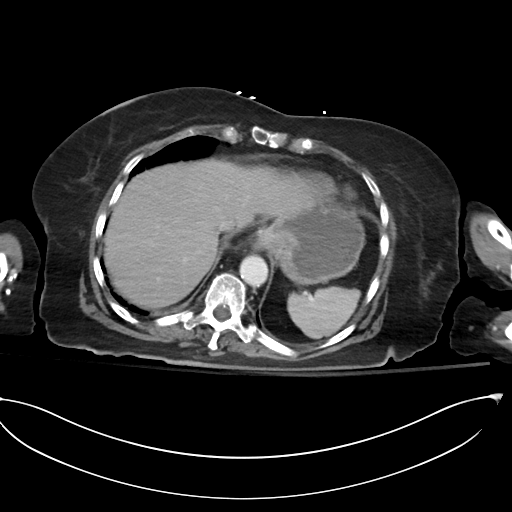
[im 105/111  soft-tissue]
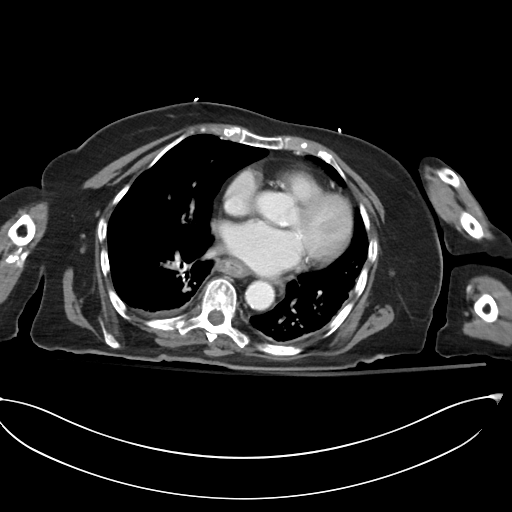

[Series 6: coronal soft tissue · coronal · 0.87mm/px · 3 of 111 slices shown]
[im 37/111  soft-tissue]
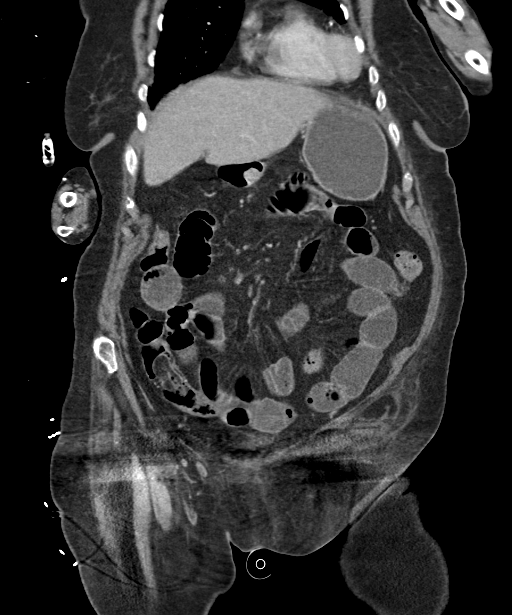
[im 49/111  soft-tissue]
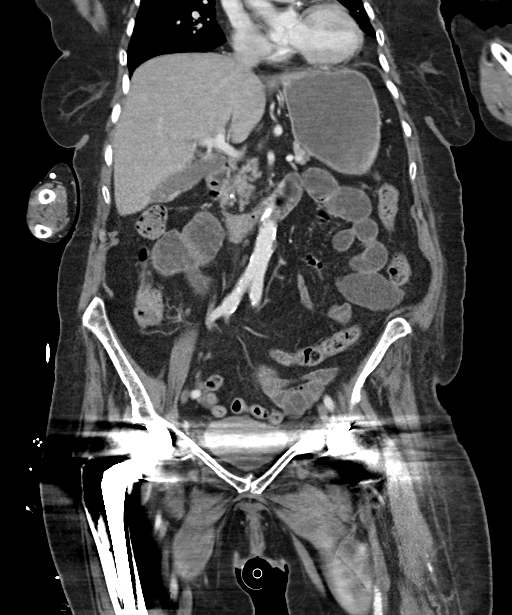
[im 62/111  soft-tissue]
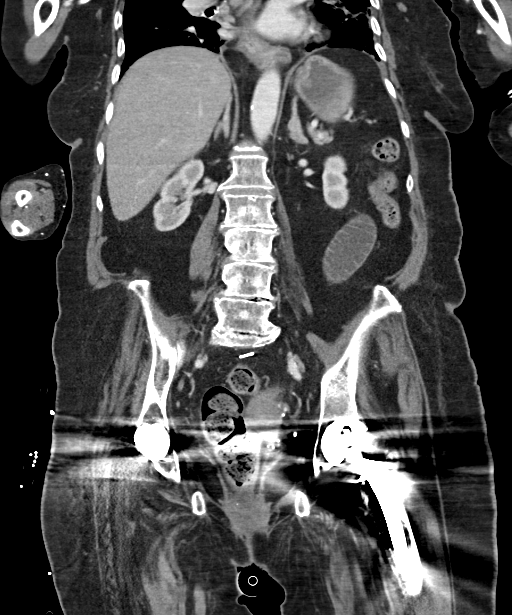

[15 of 46 positions shown; findings below may reference images not displayed]

FINDINGS: Lower chest: Bilateral lower lobe atelectasis. Bronchial wall
thickening.

Hepatobiliary: No focal liver abnormality. Calcified gallstones
noted layering within the gallbladder lumen. No gallbladder wall
thickening or pericholecystic fluid. No biliary dilatation.

Pancreas: No focal lesion. Normal pancreatic contour. No surrounding
inflammatory changes. No main pancreatic ductal dilatation.

Spleen: Normal in size without focal abnormality.

Adrenals/Urinary Tract:

No adrenal nodule bilaterally.

Bilateral kidneys enhance symmetrically.

No hydronephrosis. No hydroureter.

The urinary bladder is grossly unremarkable with limited evaluation
due to streak artifact.

Stomach/Bowel: Surgical changes related to sigmoid resection.
Stomach is within normal limits. Several loops of proximal mid small
bowel mildly distended/dilated with fluid. No associated transition
point. No small bowel thickening. No pneumatosis. No evidence of
large bowel wall thickening or dilatation. Scattered colonic
diverticulosis. The appendix is not definitely identified and may be
surgically absent.

Vascular/Lymphatic: No abdominal aorta or iliac aneurysm. Mild
atherosclerotic plaque of the aorta and its branches. No abdominal,
pelvic, or inguinal lymphadenopathy.

Reproductive: Uterus and bilateral adnexa are unremarkable.
Nonspecific left adnexal region calcification.

Other: No intraperitoneal free fluid. No intraperitoneal free gas.
No organized fluid collection.

Musculoskeletal:

Moderate to large volume fat containing infraumbilical lower
abdominal ventral wall hernia. Small fat containing right inguinal
hernia.

No suspicious lytic or blastic osseous lesions. No acute displaced
fracture. Grade 1 anterolisthesis of L4 on L5. Bilateral total hip
arthroplasties partially visualized.

Other: Limited evaluation of the pelvis due to streak artifact
originating from bilateral surgical femoral hardware.
IMPRESSION: 1. Several loops of proximal mid small bowel mildly
distended/dilated with fluid. No transition point identified.
Finding could represent an ileus versus developing early/partial
small bowel obstruction.
2. Moderate to large volume fat containing infraumbilical lower
abdominal ventral wall hernia. No associated findings to suggest
ischemia or cause of a bowel obstruction.
3. Scattered colonic diverticulosis with no acute diverticulitis in
a patient status post sigmoid resection.
4. Cholelithiasis no findings of acute cholecystitis.

## 2021-11-07 IMAGING — DX DG ABD PORTABLE 1V
2 series · 2 of 2 positions shown · non-contrast
Comparison: CT abdomen dated 12/23/2020.

CLINICAL DATA: Partial small bowel obstruction.

EXAM:
PORTABLE ABDOMEN - 1 VIEW

[abdomen kub (1 of 2)]
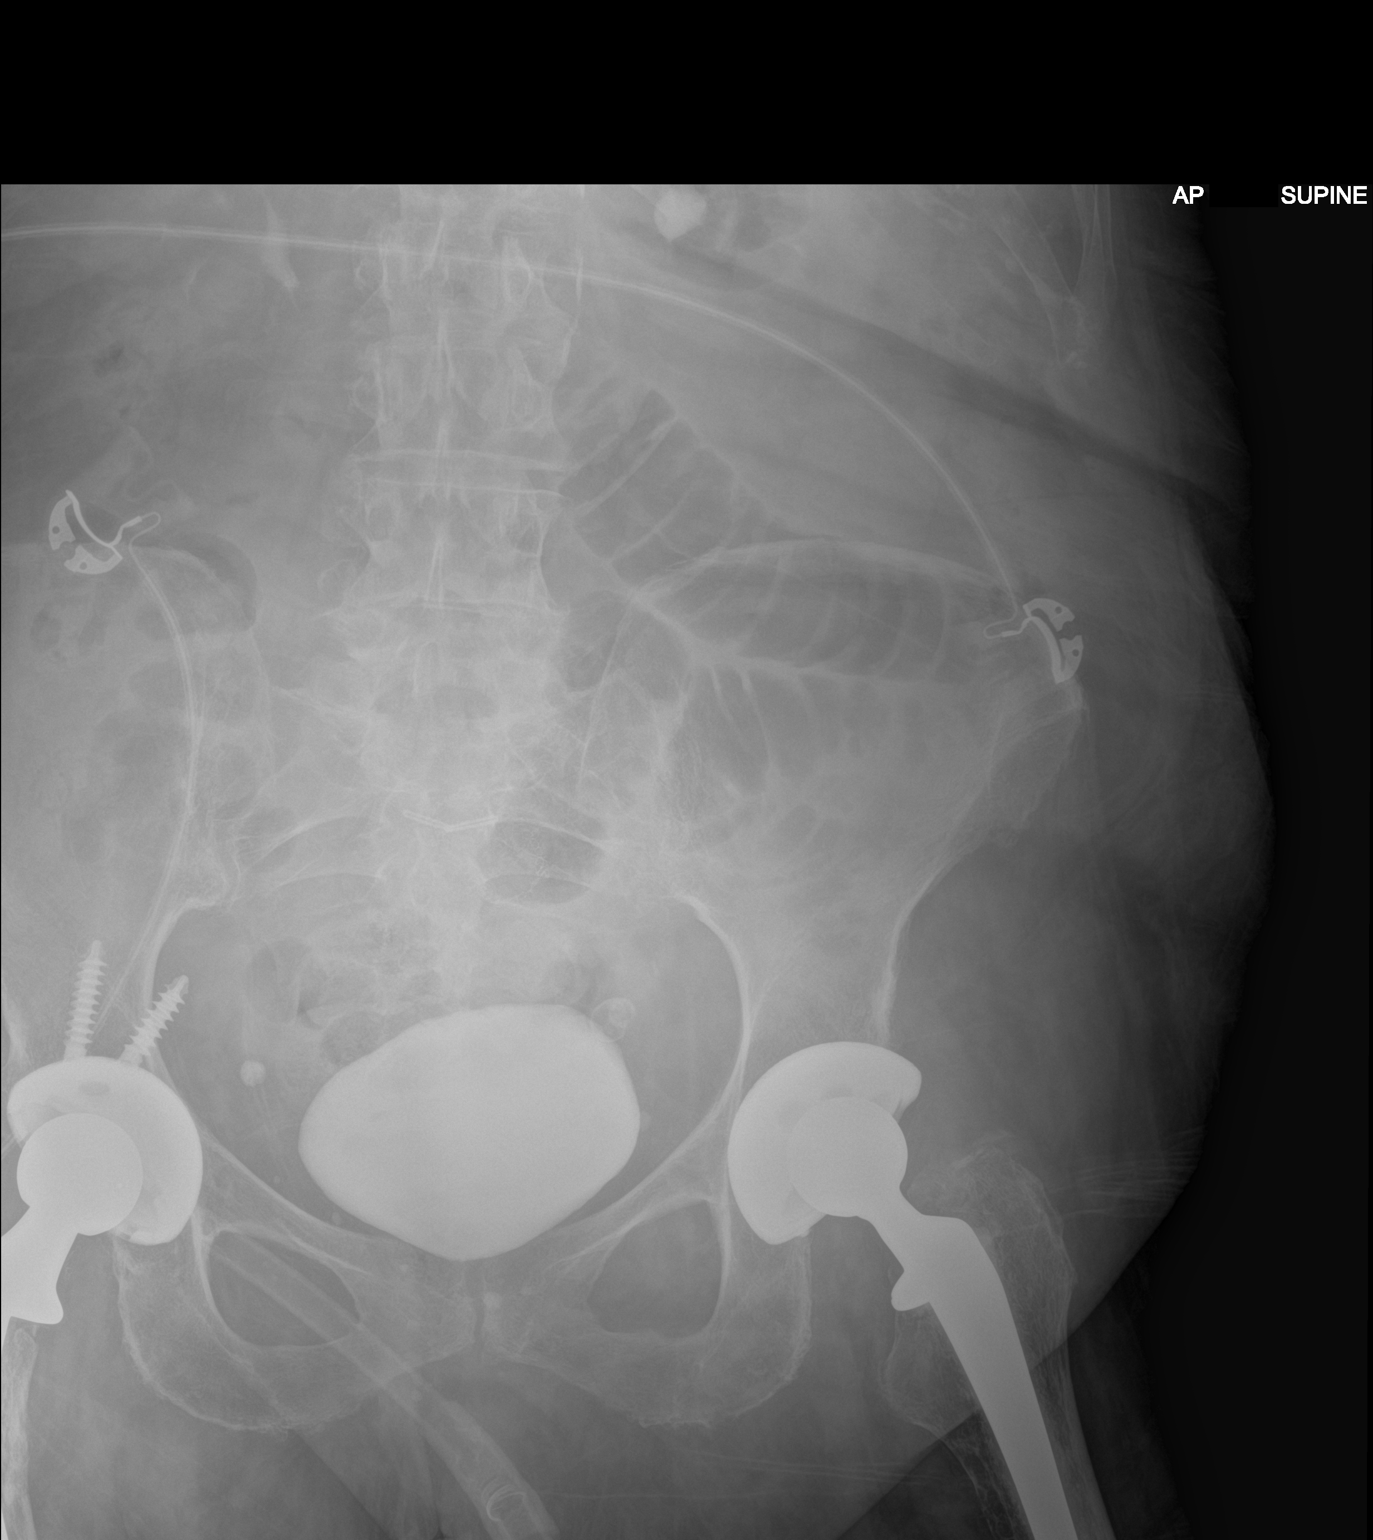

[abdomen kub (2 of 2)]
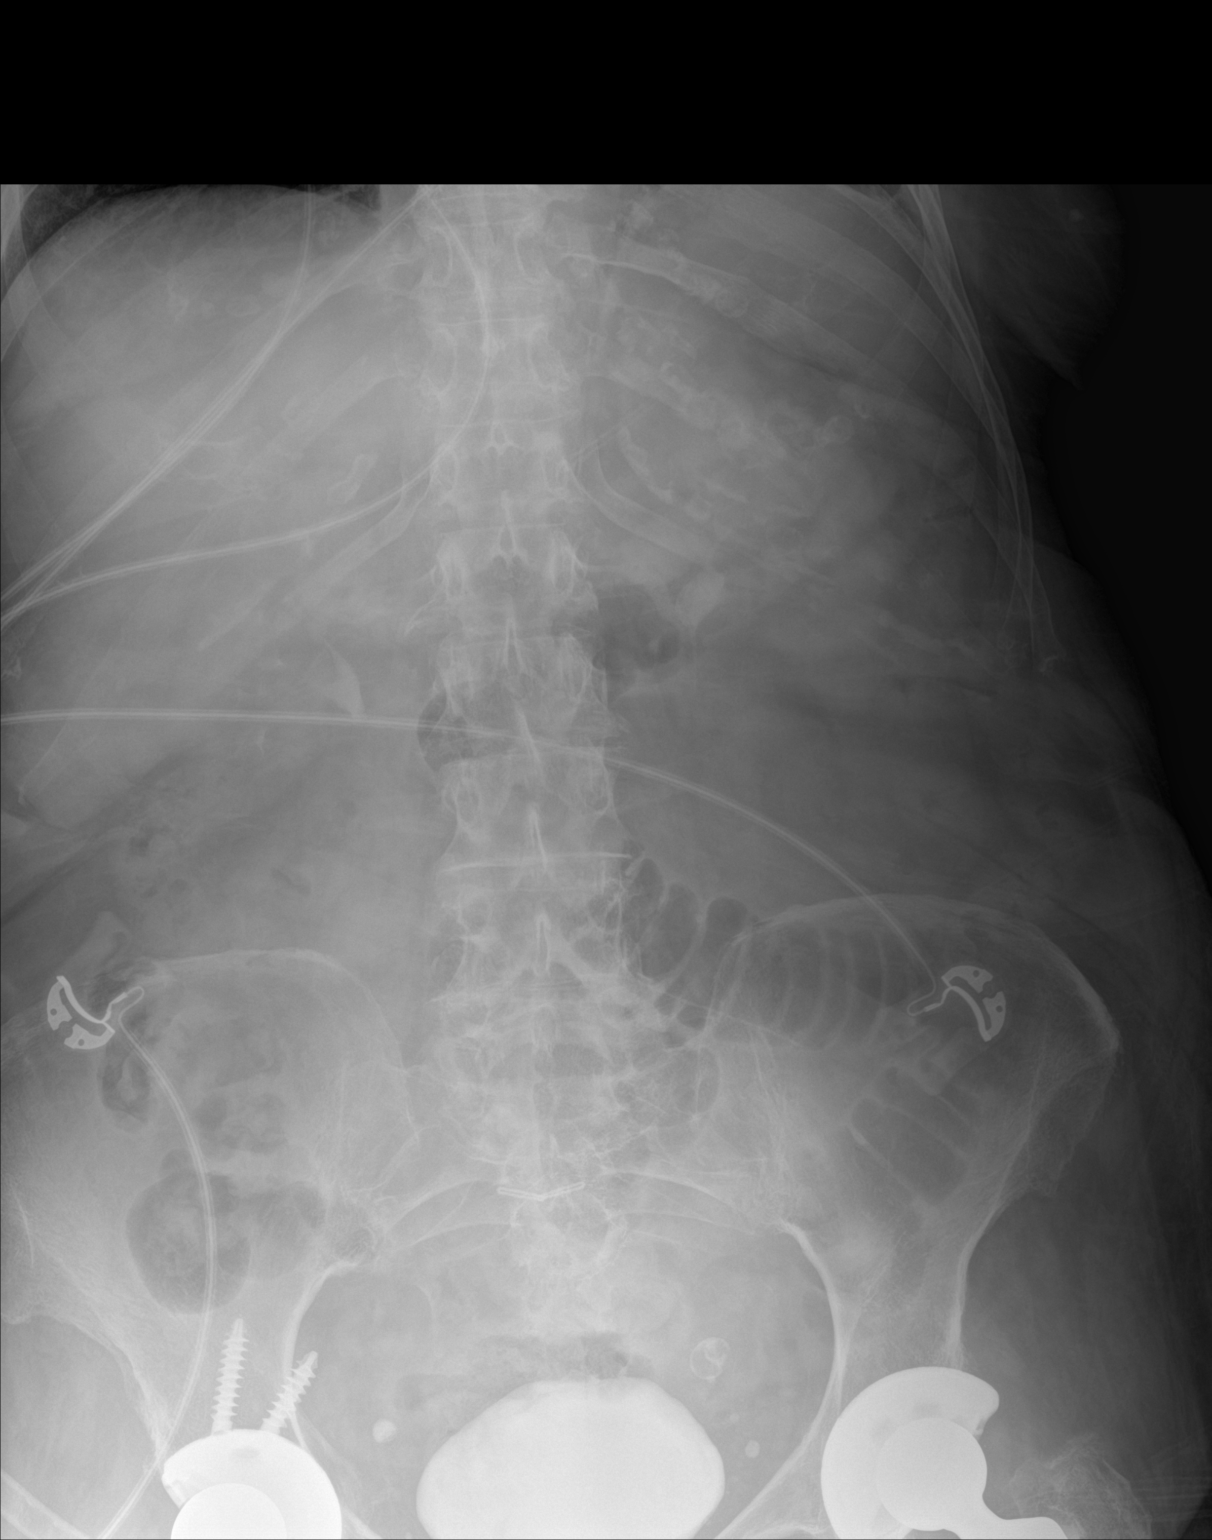

[2 of 2 positions shown; findings below may reference images not displayed]

FINDINGS: Mildly distended gas-filled small bowel loops within the LEFT lower
quadrant, measuring up to 3.8 cm diameter. No evidence of free
intraperitoneal air is seen.

Intravascular contrast material within the collecting system/bladder
from earlier CT abdomen and pelvis. Bilateral hip prostheses in
place.
IMPRESSION: Mildly distended gas-filled small bowel loops within the LEFT lower
quadrant, measuring up to 3.8 cm diameter, compatible with the given
history of partial small bowel obstruction.

## 2021-11-16 NOTE — Progress Notes (Signed)
[facility] RN NOTE  PATIENT NAME: Denise Woodward DOB: January 01, 1920 MRN: [id number]  PRIMARY CARE PROVIDER: Rodrigo Ran, MD  RESPONSIBLE PARTY: [name] (daughter) Acct ID - Guarantor Home Phone Work Phone Relationship Acct Type  [id number] BRICEIDA, RASBERRY* [phone]  Self P/F     [address], [place], [place] [address]    RN telephonic encounter completed with patient's daughter, [name]. She reports that there have been no changes in patient's condition or plan of care. Continues with sleep-wake cycles lasting 24-48 hours. No issues with pain or shortness of breath. Good appetite. Total care with all ADLs. Transferred via [employer]. Incontinent of both bowel and bladder. No skin issues at this time. Palliative care team will continue to follow.   Candiss Norse, RN BSN

## 2021-11-17 ENCOUNTER — Other Ambulatory Visit: Payer: Medicare Other | Admitting: *Deleted

## 2021-11-17 DIAGNOSIS — Z515 Encounter for palliative care: Secondary | ICD-10-CM

## 2021-12-12 NOTE — Progress Notes (Signed)
[facility] PALLIATIVE CARE RN NOTE  PATIENT NAME: Denise Woodward DOB: [birth year] MRN: [id number]  PRIMARY CARE PROVIDER: Crist Infante, MD  RESPONSIBLE PARTY: [name] (daughter) Acct ID - Guarantor Home Phone Work Phone Relationship Acct Type  [id number] FABIENNE, [phone]  Self P/F     [address] [place], [place] [address]    RN telephonic encounter completed with patient's daughter, Eustaquio Maize. She reports there have been no changes to her condition or plan of care. Patient will be turning 89 towards the end of this month. She remains total care for all ADLs. Continues with 24/48 hour sleep-wake cycles. Transferred via [employer]. Appetite is good. No dysphagia. Incontinent of both bowel and bladder and wears adult briefs. No skin issues at this time. She is glad to report that patient's condition remains stable.   Daryl Eastern, RN BSN

## 2021-12-29 ENCOUNTER — Other Ambulatory Visit: Payer: Medicare Other | Admitting: *Deleted

## 2021-12-29 DIAGNOSIS — R404 Transient alteration of awareness: Secondary | ICD-10-CM | POA: Diagnosis not present

## 2021-12-29 DIAGNOSIS — I469 Cardiac arrest, cause unspecified: Secondary | ICD-10-CM | POA: Diagnosis not present

## 2022-01-10 DEATH — deceased
